# Patient Record
Sex: Male | Born: 1985
Health system: Southern US, Community
[De-identification: ages and names within clinical notes are randomized; demographics above are authoritative.]

## PROBLEM LIST (undated history)

## (undated) DIAGNOSIS — J302 Other seasonal allergic rhinitis: Secondary | ICD-10-CM

## (undated) DIAGNOSIS — T7840XA Allergy, unspecified, initial encounter: Secondary | ICD-10-CM

## (undated) HISTORY — DX: Allergy, unspecified, initial encounter: T78.40XA

---

## 2010-11-28 ENCOUNTER — Emergency Department (HOSPITAL_COMMUNITY)
Admission: EM | Admit: 2010-11-28 | Discharge: 2010-11-28 | Payer: Self-pay | Source: Home / Self Care | Admitting: Emergency Medicine

## 2011-02-13 LAB — POCT I-STAT, CHEM 8
BUN: 26 mg/dL — ABNORMAL HIGH (ref 6–23)
Calcium, Ion: 1.05 mmol/L — ABNORMAL LOW (ref 1.12–1.32)
Creatinine, Ser: 1.2 mg/dL (ref 0.4–1.5)
HCT: 47 % (ref 39.0–52.0)
Hemoglobin: 16 g/dL (ref 13.0–17.0)
Sodium: 135 mEq/L (ref 135–145)
TCO2: 30 mmol/L (ref 0–100)

## 2011-04-13 ENCOUNTER — Inpatient Hospital Stay (INDEPENDENT_AMBULATORY_CARE_PROVIDER_SITE_OTHER)
Admission: RE | Admit: 2011-04-13 | Discharge: 2011-04-13 | Disposition: A | Discharge status: Home / Self Care | Payer: Self-pay | Source: Ambulatory Visit | Attending: Family Medicine | Admitting: Family Medicine

## 2011-04-13 DIAGNOSIS — K006 Disturbances in tooth eruption: Secondary | ICD-10-CM

## 2012-09-19 ENCOUNTER — Ambulatory Visit (HOSPITAL_COMMUNITY)
Admission: RE | Admit: 2012-09-19 | Discharge: 2012-09-19 | Disposition: A | Discharge status: Home / Self Care | Payer: Self-pay | Source: Ambulatory Visit | Attending: Obstetrics and Gynecology | Admitting: Obstetrics and Gynecology

## 2012-09-19 DIAGNOSIS — Z31448 Encounter for other genetic testing of male for procreative management: Secondary | ICD-10-CM | POA: Insufficient documentation

## 2012-10-21 ENCOUNTER — Emergency Department (HOSPITAL_COMMUNITY): Payer: Self-pay

## 2012-10-21 ENCOUNTER — Emergency Department (HOSPITAL_COMMUNITY)
Admission: EM | Admit: 2012-10-21 | Discharge: 2012-10-21 | Disposition: A | Discharge status: Home / Self Care | Payer: Self-pay | Attending: Emergency Medicine | Admitting: Emergency Medicine

## 2012-10-21 ENCOUNTER — Encounter (HOSPITAL_COMMUNITY): Payer: Self-pay | Admitting: *Deleted

## 2012-10-21 DIAGNOSIS — S92309A Fracture of unspecified metatarsal bone(s), unspecified foot, initial encounter for closed fracture: Secondary | ICD-10-CM | POA: Insufficient documentation

## 2012-10-21 DIAGNOSIS — Y9339 Activity, other involving climbing, rappelling and jumping off: Secondary | ICD-10-CM | POA: Insufficient documentation

## 2012-10-21 DIAGNOSIS — S92355A Nondisplaced fracture of fifth metatarsal bone, left foot, initial encounter for closed fracture: Secondary | ICD-10-CM

## 2012-10-21 DIAGNOSIS — Y92838 Other recreation area as the place of occurrence of the external cause: Secondary | ICD-10-CM | POA: Insufficient documentation

## 2012-10-21 DIAGNOSIS — R296 Repeated falls: Secondary | ICD-10-CM | POA: Insufficient documentation

## 2012-10-21 DIAGNOSIS — M2550 Pain in unspecified joint: Secondary | ICD-10-CM | POA: Insufficient documentation

## 2012-10-21 DIAGNOSIS — F17200 Nicotine dependence, unspecified, uncomplicated: Secondary | ICD-10-CM | POA: Insufficient documentation

## 2012-10-21 DIAGNOSIS — Y9239 Other specified sports and athletic area as the place of occurrence of the external cause: Secondary | ICD-10-CM | POA: Insufficient documentation

## 2012-10-21 MED ORDER — OXYCODONE-ACETAMINOPHEN 5-325 MG PO TABS: 1.0000 | ORAL_TABLET | ORAL | Status: DC | PRN

## 2012-10-21 MED ORDER — OXYCODONE-ACETAMINOPHEN 5-325 MG PO TABS
1.0000 | ORAL_TABLET | Freq: Once | ORAL | Status: AC
  Administered 2012-10-21: 1 via ORAL
  Filled 2012-10-21: qty 1

## 2012-10-21 NOTE — ED Provider Notes (Signed)
History  This chart was scribed for Carleene Cooper III, MD by Shari Heritage, ED Scribe. The patient was seen in room TR05C/TR05C. Patient's care was started at 1356.  CSN: [id number]  Arrival date & time 10/21/12  1041   First MD Initiated Contact with Patient 10/21/12 1356      Chief Complaint  Patient presents with  . Foot Pain    The history is provided by the patient. No language interpreter was used.    HPI Comments: Tyler Oneill is a 26 y.o. male who presents to the Emergency Department complaining of severe, constant, non-radiating, gradually worsening left foot pain with associated swelling onset yesterday night. Patient denies numbness, weakness, or any other pain at this time. Patient states that he was celebrating while watching football last night, jumped up and then landed on his foot. Patient states that the pain began immediately after the incident and then began worsening late this morning at about 3:00am. Patient reports no significant past medical or surgical history. He does not take any medications. He has no known allergies. He is a current some day smoker.   No family history on file.  History  Substance Use Topics  . Smoking status: Current Some Day Smoker  . Smokeless tobacco: Not on file  . Alcohol Use: No      Review of Systems  Musculoskeletal: Positive for arthralgias (left foot pain).  Neurological: Negative for weakness and numbness.  All other systems reviewed and are negative.    Allergies  Review of patient's allergies indicates no known allergies.  Home Medications  No current outpatient prescriptions on file.  Triage Vitals: BP 124/63  Pulse 82  Temp 98.8 F (37.1 C) (Oral)  Resp 20  SpO2 99%  Physical Exam  Nursing note and vitals reviewed. Constitutional: He is oriented to person, place, and time. He appears well-developed and well-nourished. No distress.  HENT:  Head: Normocephalic and atraumatic.  Eyes: EOM are normal.  Pupils are equal, round, and reactive to light.  Neck: Neck supple. No tracheal deviation present.  Cardiovascular: Normal rate.   Pulmonary/Chest: Effort normal. No respiratory distress.  Abdominal: Soft. He exhibits no distension.  Musculoskeletal: Normal range of motion. He exhibits no edema.       Swelling over the lateral aspect of the left foot. Intact pulses, sensation and tendons in the left foot. X-ray shows fracture of 5th metatarsal.  Neurological: He is alert and oriented to person, place, and time. No sensory deficit.  Skin: Skin is warm and dry.  Psychiatric: He has a normal mood and affect. His behavior is normal.    ED Course  Procedures (including critical care time) DIAGNOSTIC STUDIES: Oxygen Saturation is 99% on room air, normal by my interpretation.    COORDINATION OF CARE: 2:00 PM- Patient informed of current plan for treatment and evaluation and agrees with plan at this time. Will administer 1 tablet of Percodet 5-325 mg and prescribe Percocet 5-325 mg for at home use. Will order post-op shoe and crutches then discharge patient with referral to on-call orthopedist.   Dg Foot Complete Left  10/21/2012  *RADIOLOGY REPORT*  Clinical Data: Injury, pain, swelling laterally  LEFT FOOT - COMPLETE 3+ VIEW  Comparison: None.  Findings: Acute nondisplaced transverse fracture of the left fifth metatarsal base noted.  Mild localized swelling.  No radiopaque foreign body.  No significant degenerative process.  Preserved joint spaces.  IMPRESSION: Acute nondisplaced left fifth metatarsal base fracture   Original Report Authenticated By:  M. Shick, M.D.      1. Nondisplaced fracture of fifth left metatarsal bone    I personally performed the services described in this documentation, which was scribed in my presence. The recorded information has been reviewed and is accurate. Osvaldo Human, M.D.     Carleene Cooper III, MD 10/23/12 [phone]

## 2012-10-21 NOTE — ED Notes (Signed)
PT states watching football last nite and did something to his left foot

## 2012-10-21 NOTE — Progress Notes (Signed)
Orthopedic Tech Progress Note Patient Details:  Tyler Oneill 01-04-1986 [id number]  Ortho Devices Type of Ortho Device: Crutches;Postop boot Ortho Device/Splint Location: left foot Ortho Device/Splint Interventions: Application   Tyler Oneill 10/21/2012, 2:15 PM

## 2012-10-21 NOTE — ED Notes (Signed)
Pt "jumped up" while watching football game yesterday, twisting left foot. Foot swollen and discolored.

## 2013-07-28 ENCOUNTER — Emergency Department: Payer: Self-pay | Admitting: Emergency Medicine

## 2014-02-24 ENCOUNTER — Emergency Department: Payer: Self-pay | Admitting: Emergency Medicine

## 2014-06-22 ENCOUNTER — Emergency Department: Payer: Self-pay | Admitting: Emergency Medicine

## 2014-07-03 ENCOUNTER — Emergency Department: Payer: Self-pay | Admitting: Emergency Medicine

## 2015-04-14 ENCOUNTER — Emergency Department: Payer: Worker's Compensation

## 2015-04-14 ENCOUNTER — Emergency Department
Admission: EM | Admit: 2015-04-14 | Discharge: 2015-04-14 | Disposition: A | Discharge status: Home / Self Care | Payer: Worker's Compensation | Attending: Emergency Medicine | Admitting: Emergency Medicine

## 2015-04-14 DIAGNOSIS — Z720 Tobacco use: Secondary | ICD-10-CM | POA: Insufficient documentation

## 2015-04-14 DIAGNOSIS — Y9289 Other specified places as the place of occurrence of the external cause: Secondary | ICD-10-CM | POA: Diagnosis not present

## 2015-04-14 DIAGNOSIS — Y998 Other external cause status: Secondary | ICD-10-CM | POA: Insufficient documentation

## 2015-04-14 DIAGNOSIS — Y9389 Activity, other specified: Secondary | ICD-10-CM | POA: Diagnosis not present

## 2015-04-14 DIAGNOSIS — S9781XA Crushing injury of right foot, initial encounter: Secondary | ICD-10-CM | POA: Diagnosis present

## 2015-04-14 DIAGNOSIS — W230XXA Caught, crushed, jammed, or pinched between moving objects, initial encounter: Secondary | ICD-10-CM | POA: Insufficient documentation

## 2015-04-14 MED ORDER — IBUPROFEN 800 MG PO TABS: 800.0000 mg | ORAL_TABLET | Freq: Three times a day (TID) | ORAL | Status: DC | PRN

## 2015-04-14 NOTE — ED Notes (Signed)
Talked with emplyee supoervisior Wilson SingerLefebvre said to do uds

## 2015-04-14 NOTE — Discharge Instructions (Signed)
Crush Injury, Fingers or Toes A crush injury means the fingers or toes are hurt by being squeezed (compressed). HOME CARE  Raise (elevate) the injured part above the level of your heart. Do this as much as you can for the first few days.  Put ice on the injured area.  Put ice in a plastic bag.  Place a towel between your skin and the bag.  Leave the ice on for 15-20 minutes, 03-04 times a day for the first 2 days.  Only take medicine as told by your doctor.  Use the injured part only as told by your doctor.  Change bandages (dressings) as told by your doctor.  Keep all doctor visits as told. GET HELP RIGHT AWAY IF:   There is redness, puffiness (swelling), or more pain in the injured finger or toe.  Yellowish-white fluid (pus) comes from the wound.  You have a fever.  A bad smell comes from the wound or bandage.  The wound breaks open.  You cannot move the injured finger or toe. MAKE SURE YOU:   Understand these instructions.  Will watch your condition.  Will get help right away if you are not doing well or get worse. Document Released: 05/10/2010 Document Revised: 02/12/2012 Document Reviewed: 04/07/2011 Exit[employer] Patient Information 2015 [employer]. This information is not intended to replace advice given to you by your health care provider. Make sure you discuss any questions you have with your health care provider.  

## 2015-04-14 NOTE — ED Notes (Signed)
Pt was at work, a pallet fell on right foot

## 2015-04-14 NOTE — ED Provider Notes (Signed)
[facility] Emergency Department Provider Note  ____________________________________________  Time seen  [id number] I have reviewed the triage vital signs and the nursing notes.   HISTORY  Chief Complaint Foot Injury    HPI Tyler Oneill is a 29 y.o. male was injured at work approximately 1 hour prior to his arrival. He states his right foot was wheeled over by a pallet. He has not taken any medication since this happened.He describes his pain as a burning aching sensation in his right foot with a pain scale of 8 out of 10.  moving his foot makes it worse. He denies any previous injury to his foot in the past.   No past medical history on file.  There are no active problems to display for this patient.   No past surgical history on file.  Current Outpatient Rx  Name  Route  Sig  Dispense  Refill  . ibuprofen (ADVIL,MOTRIN) 800 MG tablet   Oral   Take 1 tablet (800 mg total) by mouth every 8 (eight) hours as needed.   30 tablet   0   . oxyCODONE-acetaminophen (PERCOCET/ROXICET) 5-325 MG per tablet   Oral   Take 1 tablet by mouth every 4 (four) hours as needed for pain.   20 tablet   0     Allergies Review of patient's allergies indicates no known allergies.  No family history on file.  Social History History  Substance Use Topics  . Smoking status: Current Some Day Smoker  . Smokeless tobacco: Not on file  . Alcohol Use: No    Review of Systems Constitutional: No fever/chills Eyes: No visual changes. ENT: No sore throat. Cardiovascular: Denies chest pain. Respiratory: Denies shortness of breath. Gastrointestinal: No abdominal pain.  No nausea, no vomiting.  Genitourinary: Negative for dysuria. Musculoskeletal: Negative for back pain. Skin: Negative for rash. Neurological: Negative for headaches 10-point ROS otherwise negative.  ____________________________________________   PHYSICAL EXAM:  VITAL SIGNS: ED Triage Vitals  Enc  Vitals Group     BP 04/14/15 0911 122/70 mmHg     Pulse Rate 04/14/15 0911 68     Resp 04/14/15 0911 18     Temp 04/14/15 0911 98 F (36.7 C)     Temp Source 04/14/15 0911 Oral     SpO2 04/14/15 0911 98 %     Weight 04/14/15 0911 140 lb (63.504 kg)     Height 04/14/15 0911 5\' 8"  (1.727 m)     Head Cir --      Peak Flow --      Pain Score --      Pain Loc --      Pain Edu? --      Excl. in GC? --     Constitutional: Alert and oriented. Well appearing and in no acute distress. Eyes: Conjunctivae are normal. PERRL. EOMI. Head: Atraumatic. Nose: No congestion/rhinnorhea. Neck: No stridor.   Cardiovascular: Normal rate, regular rhythm. Grossly normal heart sounds.  Good peripheral circulation. Respiratory: Normal respiratory effort.  No retractions. Lungs CTAB. Gastrointestinal: Soft and nontender. No distention. No abdominal bruits. No CVA tenderness. Genitourinary:  Musculoskeletal: No lower extremity tenderness nor edema.  No joint effusions. Neurologic:  Normal speech and language. No gross focal neurologic deficits are appreciated. Speech is normal. No gait instability. Patient is able to feel toes with light palpation. Skin:  Skin is warm, dry and intact. No rash noted. Heavy fungal nail involvement involving the toes. Psychiatric: Mood and affect are normal.  Speech and behavior are normal.  ____________________________________________   LABS (all labs ordered are listed, but only abnormal results are displayed)  Labs Reviewed - No data to display ____________________________________________  EKG  Deferred ____________________________________________  RADIOLOGY  No fracture or dislocation ____________________________________________   PROCEDURES  Procedure(s) performed: None  Critical Care performed: No  ____________________________________________   INITIAL IMPRESSION / ASSESSMENT AND PLAN / ED COURSE  Pertinent labs & imaging results that were  available during my care of the patient were reviewed by me and considered in my medical decision making (see chart for details).  Patient was informed of his x-ray results. He was placed in a wooden shoe for support. Given a prescription for ibuprofen and instructed to use ice and elevate today. 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 ____________________________________________   FINAL CLINICAL IMPRESSION(S) / ED DIAGNOSES  Final diagnoses:  Crush injury of foot, right, initial encounter     Tyler RumpsRhonda L Hong Timm, PA-C 04/14/15 1143  Sharman CheekPhillip Stafford, MD 04/14/15 1527

## 2015-04-14 NOTE — ED Notes (Signed)
Pt voided, urine did not register a temp on cup for uds

## 2015-04-14 NOTE — ED Notes (Signed)
Pain in right foot, pallet wheeled over foot, occurred at work today, no  prolfile on record will call

## 2017-05-11 ENCOUNTER — Ambulatory Visit
Admission: RE | Admit: 2017-05-11 | Discharge: 2017-05-11 | Disposition: A | Discharge status: Home / Self Care | Payer: BLUE CROSS/BLUE SHIELD | Source: Ambulatory Visit | Attending: Family Medicine | Admitting: Family Medicine

## 2017-05-11 ENCOUNTER — Other Ambulatory Visit: Payer: Self-pay | Admitting: Family Medicine

## 2017-05-11 DIAGNOSIS — M542 Cervicalgia: Secondary | ICD-10-CM

## 2017-06-20 ENCOUNTER — Emergency Department
Admission: EM | Admit: 2017-06-20 | Discharge: 2017-06-20 | Disposition: A | Discharge status: Home / Self Care | Payer: BLUE CROSS/BLUE SHIELD | Attending: Emergency Medicine | Admitting: Emergency Medicine

## 2017-06-20 ENCOUNTER — Encounter: Payer: Self-pay | Admitting: Emergency Medicine

## 2017-06-20 DIAGNOSIS — H5711 Ocular pain, right eye: Secondary | ICD-10-CM | POA: Diagnosis present

## 2017-06-20 DIAGNOSIS — H578 Other specified disorders of eye and adnexa: Secondary | ICD-10-CM | POA: Diagnosis not present

## 2017-06-20 DIAGNOSIS — F17210 Nicotine dependence, cigarettes, uncomplicated: Secondary | ICD-10-CM | POA: Diagnosis not present

## 2017-06-20 DIAGNOSIS — H5789 Other specified disorders of eye and adnexa: Secondary | ICD-10-CM

## 2017-06-20 DIAGNOSIS — H61891 Other specified disorders of right external ear: Secondary | ICD-10-CM

## 2017-06-20 HISTORY — DX: Other seasonal allergic rhinitis: J30.2

## 2017-06-20 MED ORDER — ERYTHROMYCIN 5 MG/GM OP OINT
1.0000 | TOPICAL_OINTMENT | Freq: Three times a day (TID) | OPHTHALMIC | 0 refills | Status: AC
Start: 2017-06-20 — End: 2017-06-25

## 2017-06-20 MED ORDER — ERYTHROMYCIN 5 MG/GM OP OINT
TOPICAL_OINTMENT | Freq: Once | OPHTHALMIC | Status: AC
  Administered 2017-06-20: 1 via OPHTHALMIC
  Filled 2017-06-20: qty 1

## 2017-06-20 MED ORDER — FLUORESCEIN SODIUM 0.6 MG OP STRP
1.0000 | ORAL_STRIP | Freq: Once | OPHTHALMIC | Status: AC
  Administered 2017-06-20: 1 via OPHTHALMIC
  Filled 2017-06-20: qty 1

## 2017-06-20 MED ORDER — TETRACAINE HCL 0.5 % OP SOLN
1.0000 [drp] | Freq: Once | OPHTHALMIC | Status: AC
  Administered 2017-06-20: 1 [drp] via OPHTHALMIC
  Filled 2017-06-20: qty 4

## 2017-06-20 NOTE — ED Provider Notes (Signed)
[facility] Emergency Department Provider Note   ____________________________________________   First MD Initiated Contact with Patient 06/20/17 [phone]     (approximate)  I have reviewed the triage vital signs and the nursing notes.   HISTORY  Chief Complaint Eye Pain    HPI Tyler Oneill is a 31 y.o. male who comes into the hospital today with some eye irritation. The patient reports [place] [employer] He reports occasionally he gets dust in his eye. He states that today was a normal day but when he went to bed and cut his eye felt very irritated. He thought maybe had some sleepiness I so he rinsed it out with the shower head. He reports that his eye still felt very irritated. He feels that something is in any unable to get it out. He says at some point in the past he did have a small rock in his eye that he had to physically pick out. He thinks that something scratching on his eye. When his eyes open he states he doesn't feel it when his clothes he states that he does. He denies any drainage or discharge from his eye. He denies any blurred vision. The patient wanted to come in and get it checked out.   Past Medical History:  Diagnosis Date  . Seasonal allergies     There are no active problems to display for this patient.   History reviewed. No pertinent surgical history.  Prior to Admission medications   Medication Sig Start Date End Date Taking? Authorizing Provider  erythromycin ophthalmic ointment Place 1 application into the right eye 3 (three) times daily. 06/20/17 06/25/17  Rebecka ApleyWebster, Allison P, MD  ibuprofen (ADVIL,MOTRIN) 800 MG tablet Take 1 tablet (800 mg total) by mouth every 8 (eight) hours as needed. 04/14/15   Tommi RumpsSummers, Rhonda L, PA-C  oxyCODONE-acetaminophen (PERCOCET/ROXICET) 5-325 MG per tablet Take 1 tablet by mouth every 4 (four) hours as needed for pain. 10/21/12   Carleene Cooperavidson, Alan, MD    Allergies Patient has no  known allergies.  No family history on file.  Social History Social History  Substance Use Topics  . Smoking status: Current Some Day Smoker  . Smokeless tobacco: Never Used  . Alcohol use No    Review of Systems  Constitutional: No fever/chills Eyes: right eye irritation ENT: No sore throat. Cardiovascular: Denies chest pain. Respiratory: Denies shortness of breath. Gastrointestinal: No abdominal pain.  No nausea, no vomiting.  No diarrhea.  No constipation. Genitourinary: Negative for dysuria. Musculoskeletal: Negative for back pain. Skin: Negative for rash. Neurological: Negative for headaches, focal weakness or numbness.   ____________________________________________   PHYSICAL EXAM:  VITAL SIGNS: ED Triage Vitals  Enc Vitals Group     BP 06/20/17 0640 141/80     Pulse 06/20/17 0640 81     Resp 06/20/17 0640 15     Temp 06/20/17 0640 98.3     Temp src --      SpO2 06/20/17 0640 99%     Weight 06/20/17 0552 140 lb (63.5 kg)     Height 06/20/17 0552 5\' 9"  (1.753 m)     Head Circumference --      Peak Flow --      Pain Score 06/20/17 0551 6     Pain Loc --      Pain Edu? --      Excl. in GC? --     Constitutional: Alert and oriented. Well appearing and  in mild distress. Eyes: Conjunctivae are normal. PERRL. EOMI. Visual acuity performed, no pain on exam and no foreign body visualized using the Wood's lamp no uptake with fluorescein staining, no conjunctival injection, no discharge Head: Atraumatic. Nose: No congestion/rhinnorhea. Mouth/Throat: Mucous membranes are moist.  Oropharynx non-erythematous. Cardiovascular: Normal rate, regular rhythm. Grossly normal heart sounds.  Good peripheral circulation. Respiratory: Normal respiratory effort.  No retractions. Lungs CTAB. Gastrointestinal: Soft and nontender. No distention. Positive bowel sounds Musculoskeletal: No lower extremity tenderness nor edema.  Neurologic:  Normal speech and language.  Skin:  Skin  is warm, dry and intact.  Psychiatric: Mood and affect are normal. .  ____________________________________________   LABS (all labs ordered are listed, but only abnormal results are displayed)  Labs Reviewed - No data to display ____________________________________________  EKG  none ____________________________________________  RADIOLOGY  No results found.  ____________________________________________   PROCEDURES  Procedure(s) performed: None  Procedures  Critical Care performed: No  ____________________________________________   INITIAL IMPRESSION / ASSESSMENT AND PLAN / ED COURSE  Pertinent labs & imaging results that were available during my care of the patient were reviewed by me and considered in my medical decision making (see chart for details).  This is a 31 year old male who comes into the hospital today with some irritation to his eye. I examine the patient's eye and did not visualize a foreign body. He does not have any discharge nor does he have any scleral or conjunctival injection. I will place some erythromycin ointment in the patient's eye and he'll be discharged home to follow-up with ophthalmology should he continue to have persistent discomfort. The patient's visual acuity is unremarkable.      ____________________________________________   FINAL CLINICAL IMPRESSION(S) / ED DIAGNOSES  Final diagnoses:  Eye irritation  Foreign body sensation in right ear canal      NEW MEDICATIONS STARTED DURING THIS VISIT:  Discharge Medication List as of 06/20/2017  6:33 AM    START taking these medications   Details  erythromycin ophthalmic ointment Place 1 application into the right eye 3 (three) times daily., Starting Wed 06/20/2017, Until Mon 06/25/2017, Print         Note:  This document was prepared using Dragon voice recognition software and may include unintentional dictation errors.    Rebecka Apley, MD 06/20/17 [phone]

## 2017-06-20 NOTE — Discharge Instructions (Signed)
I did not visualize a foreign body in your cornea. Please follow up with the eye doctor should you continue to have this sensation in the next few days.

## 2017-06-20 NOTE — ED Triage Notes (Signed)
Patient ambulatory to triage with steady gait, without difficulty or distress noted; pt reports upon awakening this morning, drainage and discomfort to right eye that persists after rinsing; denies any visual changes

## 2019-03-20 DIAGNOSIS — R519 Headache, unspecified: Secondary | ICD-10-CM | POA: Insufficient documentation

## 2019-04-01 ENCOUNTER — Emergency Department
Admission: EM | Admit: 2019-04-01 | Discharge: 2019-04-01 | Disposition: A | Discharge status: Home / Self Care | Payer: BLUE CROSS/BLUE SHIELD | Attending: Emergency Medicine | Admitting: Emergency Medicine

## 2019-04-01 ENCOUNTER — Emergency Department: Payer: BLUE CROSS/BLUE SHIELD

## 2019-04-01 ENCOUNTER — Encounter: Payer: Self-pay | Admitting: Emergency Medicine

## 2019-04-01 ENCOUNTER — Other Ambulatory Visit: Payer: Self-pay

## 2019-04-01 DIAGNOSIS — Z79899 Other long term (current) drug therapy: Secondary | ICD-10-CM | POA: Insufficient documentation

## 2019-04-01 DIAGNOSIS — F17210 Nicotine dependence, cigarettes, uncomplicated: Secondary | ICD-10-CM | POA: Diagnosis not present

## 2019-04-01 DIAGNOSIS — T50905A Adverse effect of unspecified drugs, medicaments and biological substances, initial encounter: Secondary | ICD-10-CM

## 2019-04-01 DIAGNOSIS — T43015A Adverse effect of tricyclic antidepressants, initial encounter: Secondary | ICD-10-CM | POA: Diagnosis not present

## 2019-04-01 DIAGNOSIS — G2402 Drug induced acute dystonia: Secondary | ICD-10-CM | POA: Diagnosis not present

## 2019-04-01 DIAGNOSIS — M542 Cervicalgia: Secondary | ICD-10-CM | POA: Diagnosis present

## 2019-04-01 LAB — CBC
HCT: 42 % (ref 39.0–52.0)
Hemoglobin: 14.1 g/dL (ref 13.0–17.0)
MCH: 30.2 pg (ref 26.0–34.0)
MCHC: 33.6 g/dL (ref 30.0–36.0)
MCV: 89.9 fL (ref 80.0–100.0)
Platelets: 179 K/uL (ref 150–400)
RBC: 4.67 MIL/uL (ref 4.22–5.81)
RDW: 14.2 % (ref 11.5–15.5)
WBC: 7.3 K/uL (ref 4.0–10.5)
nRBC: 0 % (ref 0.0–0.2)

## 2019-04-01 LAB — BASIC METABOLIC PANEL WITH GFR
Anion gap: 8 (ref 5–15)
BUN: 13 mg/dL (ref 6–20)
CO2: 26 mmol/L (ref 22–32)
Calcium: 9.2 mg/dL (ref 8.9–10.3)
Chloride: 103 mmol/L (ref 98–111)
Creatinine, Ser: 0.91 mg/dL (ref 0.61–1.24)
GFR calc Af Amer: >60 mL/min (ref >60)
GFR calc non Af Amer: >60 mL/min (ref >60)
Glucose, Bld: 120 mg/dL — ABNORMAL HIGH (ref 70–99)
Potassium: 3.8 mmol/L (ref 3.5–5.1)
Sodium: 137 mmol/L (ref 135–145)

## 2019-04-01 LAB — TROPONIN I: Troponin I: <0.03 ng/mL (ref <0.03)

## 2019-04-01 MED ORDER — METHOCARBAMOL 500 MG PO TABS: 500.0000 mg | ORAL_TABLET | Freq: Four times a day (QID) | ORAL | 0 refills | Status: DC

## 2019-04-01 MED ORDER — BUTALBITAL-APAP-CAFFEINE 50-325-40 MG PO TABS: 1.0000 | ORAL_TABLET | Freq: Four times a day (QID) | ORAL | 0 refills | Status: DC | PRN

## 2019-04-01 MED ORDER — GADOBUTROL 1 MMOL/ML IV SOLN
6.0000 mL | Freq: Once | INTRAVENOUS | Status: AC | PRN
  Administered 2019-04-01: 17:00:00 6 mL via INTRAVENOUS
  Filled 2019-04-01: qty 6

## 2019-04-01 MED ORDER — OXYCODONE-ACETAMINOPHEN 5-325 MG PO TABS
1.0000 | ORAL_TABLET | Freq: Once | ORAL | Status: AC
  Administered 2019-04-01: 16:00:00 1 via ORAL
  Filled 2019-04-01: qty 1

## 2019-04-01 MED ORDER — MELOXICAM 15 MG PO TABS: 15.0000 mg | ORAL_TABLET | Freq: Every day | ORAL | 0 refills | Status: DC

## 2019-04-01 MED ORDER — BUTALBITAL-APAP-CAFFEINE 50-325-40 MG PO TABS: 1.0000 | ORAL_TABLET | Freq: Four times a day (QID) | ORAL | 0 refills | Status: AC | PRN

## 2019-04-01 MED ORDER — KETOROLAC TROMETHAMINE 30 MG/ML IJ SOLN
30.0000 mg | Freq: Once | INTRAMUSCULAR | Status: AC
  Administered 2019-04-01: 30 mg via INTRAVENOUS
  Filled 2019-04-01: qty 1

## 2019-04-01 MED ORDER — LIDOCAINE 5 % EX PTCH
1.0000 | MEDICATED_PATCH | CUTANEOUS | Status: DC
  Administered 2019-04-01: 11:00:00 1 via TRANSDERMAL
  Filled 2019-04-01: qty 1

## 2019-04-01 MED ORDER — DIPHENHYDRAMINE HCL 50 MG/ML IJ SOLN
25.0000 mg | Freq: Once | INTRAMUSCULAR | Status: AC
  Administered 2019-04-01: 16:00:00 25 mg via INTRAVENOUS
  Filled 2019-04-01: qty 1

## 2019-04-01 MED ORDER — SODIUM CHLORIDE 0.9 % IV BOLUS
1000.0000 mL | Freq: Once | INTRAVENOUS | Status: AC
  Administered 2019-04-01: 11:00:00 1000 mL via INTRAVENOUS

## 2019-04-01 MED ORDER — ORPHENADRINE CITRATE 30 MG/ML IJ SOLN
60.0000 mg | Freq: Two times a day (BID) | INTRAMUSCULAR | Status: DC
  Administered 2019-04-01: 11:00:00 60 mg via INTRAVENOUS
  Filled 2019-04-01: qty 2

## 2019-04-01 MED ORDER — DIPHENHYDRAMINE HCL 50 MG/ML IJ SOLN
25.0000 mg | Freq: Once | INTRAMUSCULAR | Status: AC
  Administered 2019-04-01: 14:00:00 25 mg via INTRAVENOUS
  Filled 2019-04-01: qty 1

## 2019-04-01 NOTE — ED Triage Notes (Signed)
Pt arrives with complaints of neck and back pain. PT reports the pains were first present 2 years prior due to strain from work. Pt states the pain has been intermittent since. However, pt states since Saturday the pain has been constant. Pt also diagnosed with tension headaches from PCP. Pt given medication for headaches.

## 2019-04-01 NOTE — ED Notes (Signed)
Back from MRI  Awaiting results 

## 2019-04-01 NOTE — ED Notes (Signed)
Pt is speaking with MRI   Min relief with meds

## 2019-04-01 NOTE — ED Notes (Signed)
See triage note  Presents from [facility] with neck and upper back pain  States pain is like a stiffness to neck and moves into upper back and bilateral shoulder blades  States pain started on Saturday  Denies any injury or fever  States he has hahd headaches and was placed on Pamelor for same  States headache are better at present  Has only tried heat to neck for pain

## 2019-04-01 NOTE — ED Provider Notes (Signed)
[facility] Emergency Department Provider Note  ____________________________________________  Time seen: Approximately 9:54 AM  I have reviewed the triage vital signs and the nursing notes.   HISTORY  Chief Complaint Back Pain    HPI Tyler Oneill is a 33 y.o. male that presents emergency department for evaluation of neck pain for 4 days.  Patient states that it feels like his spine is "locked up." Pain is worse to his low neck and upper back.  Pain radiates into both of his shoulders.  He has pain with any range of motion of his spine or neck.  Pain is worse when he turns his neck to the side.  Patient states that he was started on nortriptyline 1.5 weeks ago for headaches.  Nortriptyline significantly improved his headaches and he denies any headache today.  He was scheduled to have a follow-up appointment with neurology on Thursday but could not wait that long.  He went to [facility], who brought him to the emergency department for severe pain.  No IV drug use.  His father had a spinal abscess when he was young.  No fever, chills, dizziness, shortness of breath, chest pain, palpitation, nausea, vomiting, abdominal pain.  Past Medical History:  Diagnosis Date  . Seasonal allergies     There are no active problems to display for this patient.   History reviewed. No pertinent surgical history.  Prior to Admission medications   Medication Sig Start Date End Date Taking? Authorizing Provider  nortriptyline (PAMELOR) 10 MG capsule Take 20 mg by mouth at bedtime.    Yes [provider]  acetaminophen (TYLENOL) 500 MG tablet Take 1,000 mg by mouth every 6 (six) hours.    [provider]  fluticasone (FLONASE) 50 MCG/ACT nasal spray Place 2 sprays into the nose daily.    [provider]    Allergies Patient has no known allergies.  No family history on file.  Social History Social History   Tobacco Use  . Smoking status:  Current Some Day Smoker  . Smokeless tobacco: Never Used  Substance Use Topics  . Alcohol use: No  . Drug use: No     Review of Systems  Constitutional: No fever/chills Cardiovascular: No chest pain. Respiratory: No SOB. Gastrointestinal: No abdominal pain.  No nausea, no vomiting.  Musculoskeletal: Positive for neck and back pain. Skin: Negative for rash, abrasions, lacerations, ecchymosis. Neurological: Negative for headaches, numbness or tingling   ____________________________________________   PHYSICAL EXAM:  VITAL SIGNS: ED Triage Vitals  Enc Vitals Group     BP 04/01/19 0904 120/81     Pulse Rate 04/01/19 0904 (!) 112     Resp 04/01/19 0904 16     Temp 04/01/19 0904 98.2 F (36.8 C)     Temp Source 04/01/19 0904 Oral     SpO2 04/01/19 0904 99 %     Weight 04/01/19 0905 135 lb (61.2 kg)     Height 04/01/19 0905 5\' 8"  (1.727 m)     Head Circumference --      Peak Flow --      Pain Score 04/01/19 0905 10     Pain Loc --      Pain Edu? --      Excl. in GC? --      Constitutional: Alert and oriented. Well appearing and in no acute distress. Eyes: Conjunctivae are normal. PERRL. EOMI. Head: Atraumatic. ENT:      Ears:      Nose:  No congestion/rhinnorhea.      Mouth/Throat: Mucous membranes are moist.  Neck: No stridor.  Inferior cervical spine tenderness to palpation.  Pain with right rotation of neck.  Limited range of motion of neck due to pain. Cardiovascular: Normal rate, regular rhythm.  Good peripheral circulation. Respiratory: Normal respiratory effort without tachypnea or retractions. Lungs CTAB. Good air entry to the bases with no decreased or absent breath sounds. Musculoskeletal: Full range of motion to all extremities. No gross deformities appreciated.  Strength equal in upper extremities bilaterally.  Tenderness to palpation throughout lumbar spine and lumbar paraspinal muscles.  Limited flexion, extension, rotation of thoracic spine due to  pain. Neurologic:  Normal speech and language. No gross focal neurologic deficits are appreciated. Negative Brusinski, Kernig. Skin:  Skin is warm, dry and intact. No rash noted. Psychiatric: Mood and affect are normal. Speech and behavior are normal. Patient exhibits appropriate insight and judgement.   ____________________________________________   LABS (all labs ordered are listed, but only abnormal results are displayed)  Labs Reviewed  BASIC METABOLIC PANEL - Abnormal; Notable for the following components:      Result Value   Glucose, Bld 120 (*)    All other components within normal limits  CBC  TROPONIN I   ____________________________________________  EKG   ____________________________________________  RADIOLOGY Lexine Baton, personally viewed and evaluated these images (plain radiographs) as part of my medical decision making, as well as reviewing the written report by the radiologist.  Dg Chest 2 View  Result Date: 04/01/2019 CLINICAL DATA:  Chest pain EXAM: CHEST - 2 VIEW COMPARISON:  July 28, 2013 FINDINGS: The lungs are clear. The heart size and pulmonary vascularity are normal. No adenopathy. No pneumothorax. No bone lesions. IMPRESSION: No edema or consolidation. Electronically Signed   By: Bretta Bang III M.D.   On: 04/01/2019 10:28   Dg Cervical Spine 2-3 Views  Result Date: 04/01/2019 CLINICAL DATA:  Cervicalgia EXAM: CERVICAL SPINE - 2-3 VIEW COMPARISON:  May 11, 2017 FINDINGS: Frontal, lateral, and open-mouth odontoid images were obtained. There is no fracture or spondylolisthesis. Prevertebral soft tissues and predental space regions are normal. There is mild disc space narrowing at C7-T1. Other disc spaces appear unremarkable. No erosive change. Lung apices are clear. IMPRESSION: Mild disc space narrowing at C7-T1. Other disc spaces appear unremarkable. No fracture or spondylolisthesis. Electronically Signed   By: Bretta Bang III M.D.   On:  04/01/2019 10:27    ____________________________________________    PROCEDURES  Procedure(s) performed:    Procedures    Medications  orphenadrine (NORFLEX) injection 60 mg (60 mg Intravenous Given 04/01/19 1039)  lidocaine (LIDODERM) 5 % 1 patch (1 patch Transdermal Patch Applied 04/01/19 1039)  ketorolac (TORADOL) 30 MG/ML injection 30 mg (30 mg Intravenous Given 04/01/19 1039)  sodium chloride 0.9 % bolus 1,000 mL (0 mLs Intravenous Stopped 04/01/19 1349)  diphenhydrAMINE (BENADRYL) injection 25 mg (25 mg Intravenous Given 04/01/19 1349)  diphenhydrAMINE (BENADRYL) injection 25 mg (25 mg Intravenous Given 04/01/19 1540)  oxyCODONE-acetaminophen (PERCOCET/ROXICET) 5-325 MG per tablet 1 tablet (1 tablet Oral Given 04/01/19 1540)     ____________________________________________   INITIAL IMPRESSION / ASSESSMENT AND PLAN / ED COURSE  Pertinent labs & imaging results that were available during my care of the patient were reviewed by me and considered in my medical decision making (see chart for details).  Review of the  CSRS was performed in accordance of the [place] prior to dispensing any controlled drugs.  Patient presented to emergency department for evaluation of neck pain for 4 days.  Vital signs and lab work are reassuring.  Chest x-ray and cervical x-ray are negative for acute bony abnormalities.  Symptoms are likely dystonic due to new nortriptyline prescription.  Patient was given Norflex and Toradol for pain with minimal improvement of symptoms.  He was given a dose of Benadryl for possible dystonic reaction.  Unlikely meningitis as patient has no meningeal signs, headache, increased WBC on CBC.  Unlikely cardiac etiology as pain is reproducible.  Troponin is negative. EKG shows normal sinus rhythm. He was mildly tachycardic on arrival which resolved with fluid bolus. MRI was ordered to rule out spinal abscess or mass.  Care was transferred to PA  Cuthriell awaiting MRI  with plans to discharge home. Patient is to follow up with neurology as directed. Patient is given ED precautions to return to the ED for any worsening or new symptoms.     ____________________________________________  FINAL CLINICAL IMPRESSION(S) / ED DIAGNOSES  Final diagnoses:  None      NEW MEDICATIONS STARTED DURING THIS VISIT:  ED Discharge Orders    None          This chart was dictated using voice recognition software/Dragon. Despite best efforts to proofread, errors can occur which can change the meaning. Any change was purely unintentional.    Enid DerryWagner, Chia Rock, PA-C 04/01/19 1617    Emily FilbertWilliams, Jonathan E, MD 04/02/19 1344

## 2019-04-01 NOTE — ED Notes (Signed)
Resting at present  Awaiting fir MRI

## 2019-04-01 NOTE — ED Provider Notes (Signed)
-----------------------------------------   6:34 PM on 04/01/2019 -----------------------------------------   Blood pressure 110/62, pulse 73, temperature 98.1 F (36.7 C), temperature source Oral, resp. rate 18, height 5\' 8"  (1.727 m), weight 61.2 kg, SpO2 99 %.  Assuming care from Ronni Rumble, PA-C in short, Tyler Oneill is a 33 y.o. male with a chief complaint of Back Pain .  Refer to the original H&P for additional details.  The current plan of care is to await MRI results.  Patient presents emergency department with neck pain, upper back pain and stiffness.  See previous providers note for HPI, physical exam.  At this time, patient care was transferred to myself pending MRI.  This returns with mild C6-C7 bulge.  With this finding, I do not suspect that this is the root cause of patient's symptoms.  Patient was recently started on amitriptyline, had an increase in dose of amitriptyline the day before symptoms began.  I suspect that this is a dystonic reaction.  After Benadryl, patient has been improving in symptoms.  Patient is encouraged to use 50 mg of Benadryl tonight before bed and to repeat tomorrow if necessary.  I will provide anti-inflammatory muscle relaxer to further alleviate patient's symptoms.  He has an appointment with neurology in 2 days to evaluate effect of his amitriptyline.  Have advised him not to restart the medication until talking to neurology.  I will prescribe Fioricet for headache should he have a headache developed in the next 36 hours prior to seeing neurology.  ED diagnosis: Dystonic reaction  New medications: Meloxicam Robaxin Fioricet        Lanette Hampshire 04/01/19 Daneil Dan, MD 04/01/19 2015

## 2019-04-01 NOTE — ED Notes (Signed)
Patient transported to MRI 

## 2019-06-27 DIAGNOSIS — Z20828 Contact with and (suspected) exposure to other viral communicable diseases: Secondary | ICD-10-CM | POA: Diagnosis not present

## 2020-03-31 DIAGNOSIS — H5213 Myopia, bilateral: Secondary | ICD-10-CM | POA: Diagnosis not present

## 2020-05-22 IMAGING — CR CERVICAL SPINE - 2-3 VIEW
3 series · 3 of 3 positions shown · non-contrast
Comparison: May 11, 2017

CLINICAL DATA: Cervicalgia

EXAM:
CERVICAL SPINE - 2-3 VIEW

[c-spine lat]
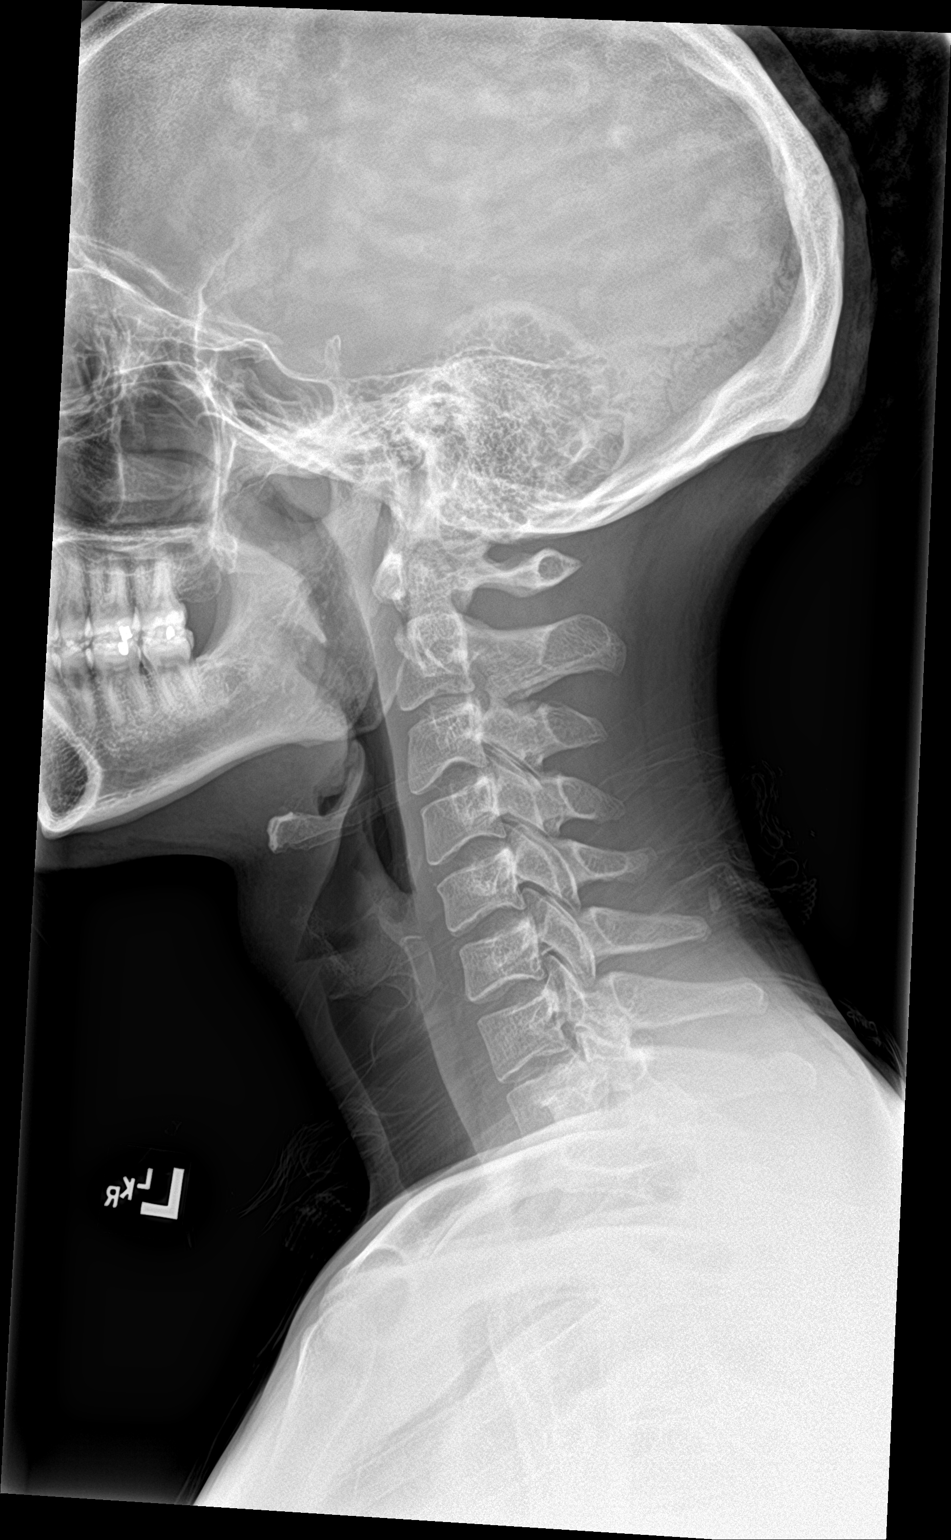

[c-spine ap]
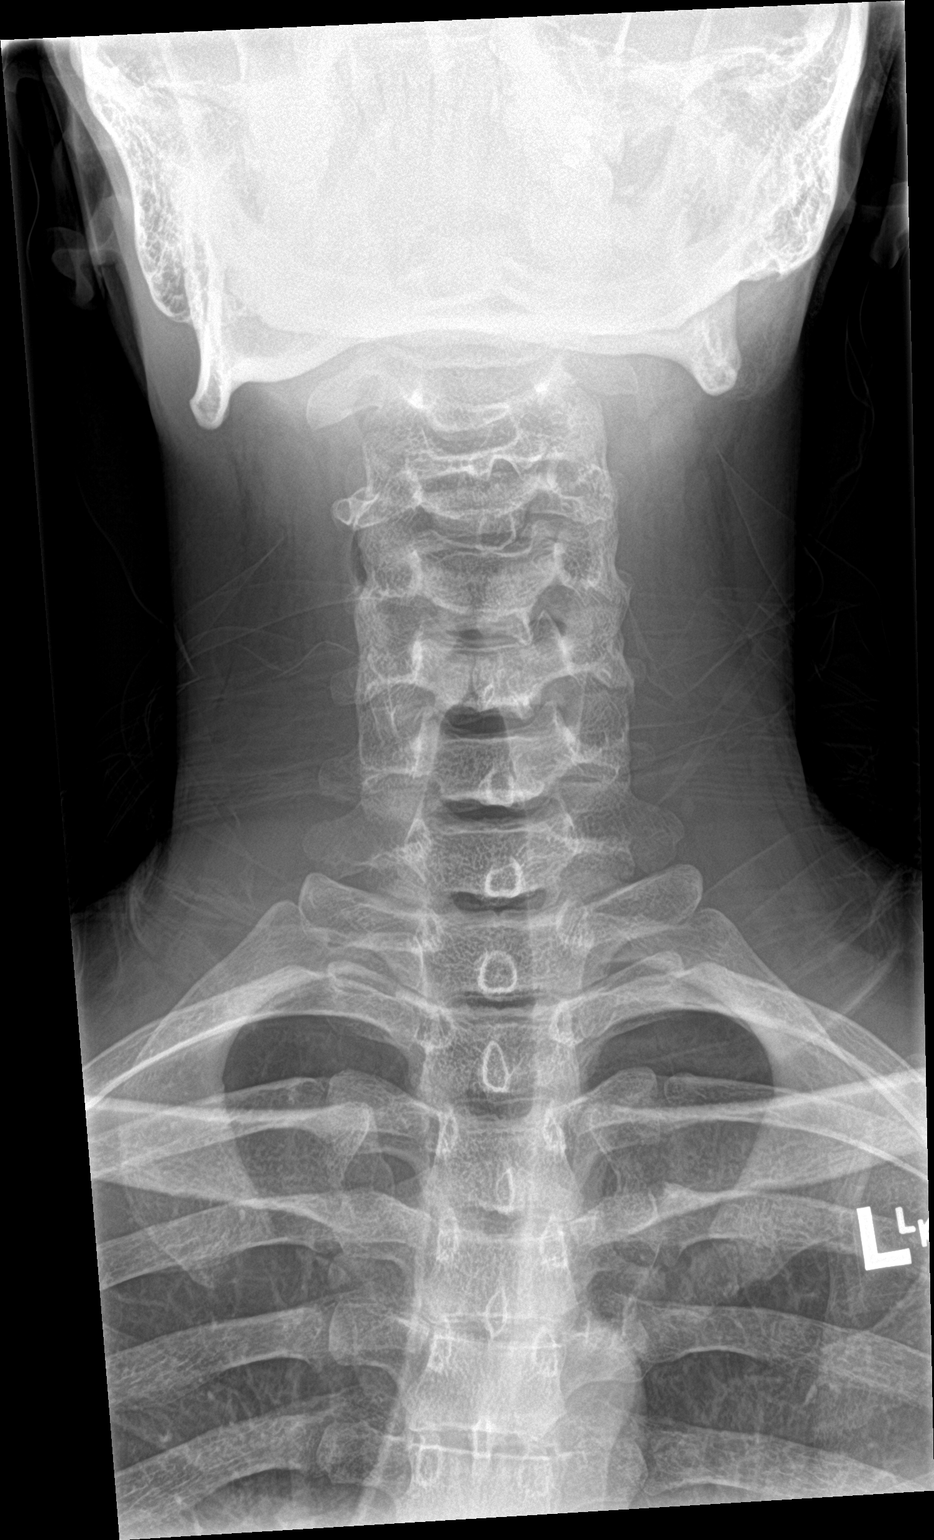

[c-spine open mouth]
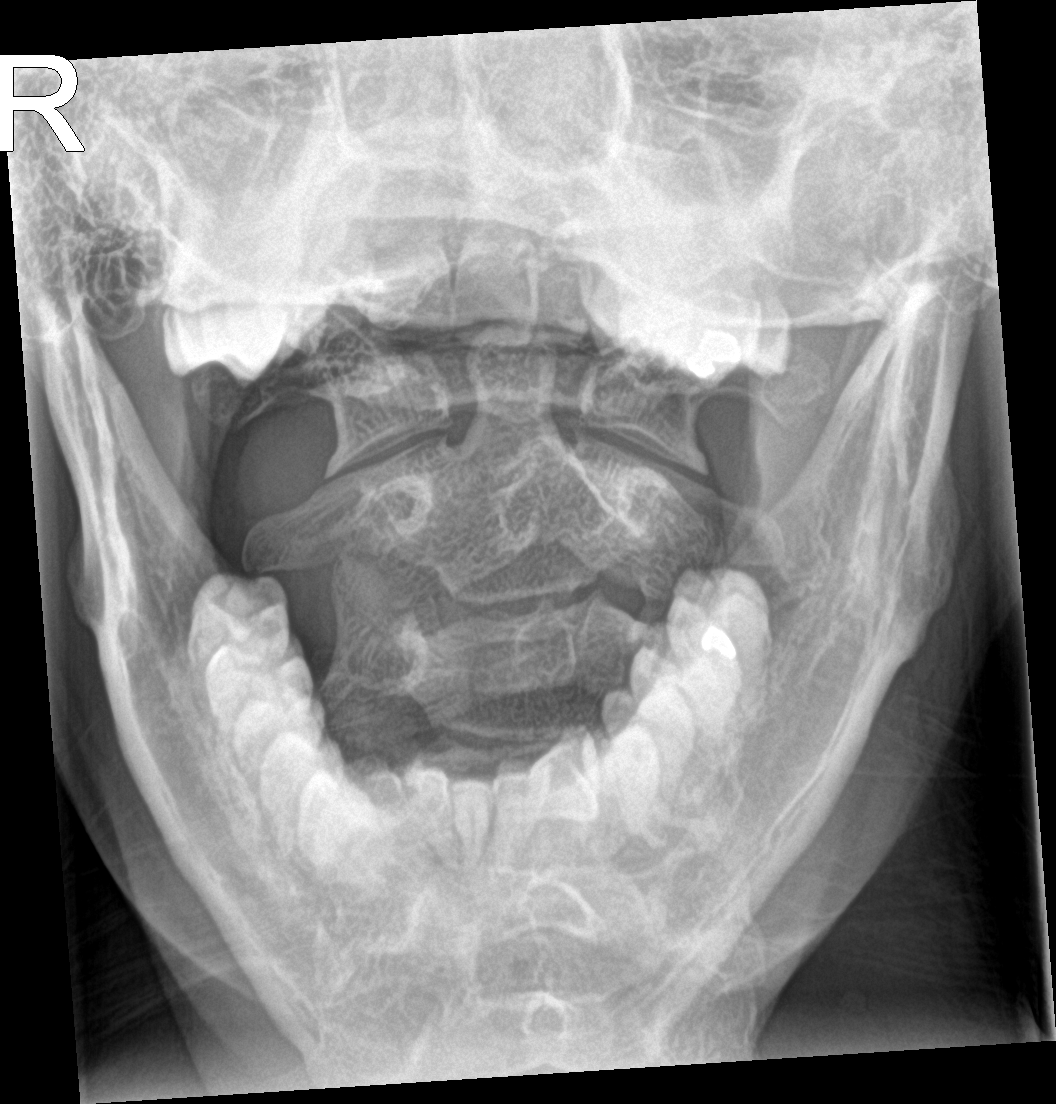

[3 of 3 positions shown; findings below may reference images not displayed]

FINDINGS: Frontal, lateral, and open-mouth odontoid images were obtained.
There is no fracture or spondylolisthesis. Prevertebral soft tissues
and predental space regions are normal. There is mild disc space
narrowing at C7-T1. Other disc spaces appear unremarkable. No
erosive change. Lung apices are clear.
IMPRESSION: Mild disc space narrowing at C7-T1. Other disc spaces appear
unremarkable. No fracture or spondylolisthesis.

## 2020-07-20 ENCOUNTER — Other Ambulatory Visit: Payer: Self-pay

## 2020-07-20 ENCOUNTER — Ambulatory Visit (INDEPENDENT_AMBULATORY_CARE_PROVIDER_SITE_OTHER): Payer: 59

## 2020-07-20 DIAGNOSIS — Z23 Encounter for immunization: Secondary | ICD-10-CM | POA: Diagnosis not present

## 2020-07-20 NOTE — Progress Notes (Signed)
   Covid-19 Vaccination Clinic  Name:  [name]    MRN: [id number] DOB: 1986-10-08  07/20/2020  Mr. Carlyon was observed post Covid-19 immunization for 15 minutes without incident. He was provided with Vaccine Information Sheet and instruction to access the V-Safe system.   Mr. Haubner was instructed to call 911 with any severe reactions post vaccine: Marland Kitchen Difficulty breathing  . Swelling of face and throat  . A fast heartbeat  . A bad rash all over body  . Dizziness and weakness   Immunizations Administered    Name Date Dose VIS Date Route   Pfizer COVID-19 Vaccine 07/20/2020  5:10 PM 0.3 mL 01/28/2019 Intramuscular   Manufacturer: [employer], [employer]   Lot: [id number]   NDC: [address]-5

## 2020-08-17 ENCOUNTER — Ambulatory Visit (INDEPENDENT_AMBULATORY_CARE_PROVIDER_SITE_OTHER): Payer: 59

## 2020-08-17 DIAGNOSIS — Z23 Encounter for immunization: Secondary | ICD-10-CM

## 2020-08-17 NOTE — Progress Notes (Signed)
° °  Covid-19 Vaccination Clinic  Name:  Tyler Oneill    MRN: [id number] DOB: 29-Dec-1985  08/17/2020  Mr. [name] was observed post Covid-19 immunization for 15 minutes without incident. He was provided with Vaccine Information Sheet and instruction to access the V-Safe system.   Mr. Shin was instructed to call 911 with any severe reactions post vaccine:  Difficulty breathing   Swelling of face and throat   A fast heartbeat   A bad rash all over body   Dizziness and weakness   Immunizations Administered    Name Date Dose VIS Date Route   Pfizer COVID-19 Vaccine 08/17/2020  5:01 PM 0.3 mL 01/28/2019 Intramuscular   Manufacturer: [employer], [employer]   Lot: [id number]   NDC: [address]-6

## 2020-11-02 DIAGNOSIS — Z20822 Contact with and (suspected) exposure to covid-19: Secondary | ICD-10-CM | POA: Diagnosis not present

## 2020-11-02 DIAGNOSIS — Z03818 Encounter for observation for suspected exposure to other biological agents ruled out: Secondary | ICD-10-CM | POA: Diagnosis not present

## 2020-11-02 DIAGNOSIS — U071 COVID-19: Secondary | ICD-10-CM | POA: Diagnosis not present

## 2020-11-09 DIAGNOSIS — Z1152 Encounter for screening for COVID-19: Secondary | ICD-10-CM | POA: Diagnosis not present

## 2020-11-09 DIAGNOSIS — Z03818 Encounter for observation for suspected exposure to other biological agents ruled out: Secondary | ICD-10-CM | POA: Diagnosis not present

## 2021-04-26 ENCOUNTER — Other Ambulatory Visit: Payer: Self-pay

## 2021-04-26 ENCOUNTER — Ambulatory Visit: Payer: Self-pay

## 2021-04-26 DIAGNOSIS — Z0000 Encounter for general adult medical examination without abnormal findings: Secondary | ICD-10-CM

## 2021-04-26 DIAGNOSIS — E611 Iron deficiency: Secondary | ICD-10-CM | POA: Insufficient documentation

## 2021-04-26 NOTE — Progress Notes (Signed)
Scheduled to complete physical 05/05/21 with Adam Scarboro, NP-C.  AMD 

## 2021-04-27 LAB — CMP12+LP+TP+TSH+6AC+CBC/D/PLT
ALT: 16 IU/L (ref 0–44)
AST: 23 IU/L (ref 0–40)
Albumin/Globulin Ratio: 2.2 (ref 1.2–2.2)
Albumin: 4.7 g/dL (ref 4.0–5.0)
Alkaline Phosphatase: 67 IU/L (ref 44–121)
BUN/Creatinine Ratio: 17 (ref 9–20)
BUN: 16 mg/dL (ref 6–20)
Basophils Absolute: 0 10*3/uL (ref 0.0–0.2)
Basos: 1 %
Bilirubin Total: 0.4 mg/dL (ref 0.0–1.2)
Calcium: 9.4 mg/dL (ref 8.7–10.2)
Chloride: 104 mmol/L (ref 96–106)
Chol/HDL Ratio: 2 ratio (ref 0.0–5.0)
Cholesterol, Total: 134 mg/dL (ref 100–199)
Creatinine, Ser: 0.93 mg/dL (ref 0.76–1.27)
EOS (ABSOLUTE): 0.2 10*3/uL (ref 0.0–0.4)
Eos: 4 %
Estimated CHD Risk: <0.5 times avg. (ref 0.0–1.0)
Free Thyroxine Index: 1.9 (ref 1.2–4.9)
GGT: 20 IU/L (ref 0–65)
Globulin, Total: 2.1 g/dL (ref 1.5–4.5)
Glucose: 86 mg/dL (ref 65–99)
HDL: 67 mg/dL (ref >39)
Hematocrit: 41.4 % (ref 37.5–51.0)
Hemoglobin: 13.5 g/dL (ref 13.0–17.7)
Immature Grans (Abs): 0 10*3/uL (ref 0.0–0.1)
Immature Granulocytes: 0 %
Iron: 74 ug/dL (ref 38–169)
LDH: 130 IU/L (ref 121–224)
LDL Chol Calc (NIH): 53 mg/dL (ref 0–99)
Lymphocytes Absolute: 2.2 10*3/uL (ref 0.7–3.1)
Lymphs: 44 %
MCH: 30.1 pg (ref 26.6–33.0)
MCHC: 32.6 g/dL (ref 31.5–35.7)
MCV: 92 fL (ref 79–97)
Monocytes Absolute: 0.5 10*3/uL (ref 0.1–0.9)
Monocytes: 10 %
Neutrophils Absolute: 2.1 10*3/uL (ref 1.4–7.0)
Neutrophils: 41 %
Phosphorus: 3.1 mg/dL (ref 2.8–4.1)
Platelets: 181 10*3/uL (ref 150–450)
Potassium: 4.4 mmol/L (ref 3.5–5.2)
RBC: 4.48 x10E6/uL (ref 4.14–5.80)
RDW: 13.1 % (ref 11.6–15.4)
Sodium: 141 mmol/L (ref 134–144)
T3 Uptake Ratio: 24 % (ref 24–39)
T4, Total: 7.8 ug/dL (ref 4.5–12.0)
TSH: 1.2 u[IU]/mL (ref 0.450–4.500)
Total Protein: 6.8 g/dL (ref 6.0–8.5)
Triglycerides: 69 mg/dL (ref 0–149)
Uric Acid: 3.5 mg/dL — ABNORMAL LOW (ref 3.8–8.4)
VLDL Cholesterol Cal: 14 mg/dL (ref 5–40)
WBC: 5 10*3/uL (ref 3.4–10.8)
eGFR: 111 mL/min/{1.73_m2} (ref >59)

## 2021-05-05 ENCOUNTER — Other Ambulatory Visit: Payer: Self-pay

## 2021-05-05 ENCOUNTER — Encounter: Payer: Self-pay | Admitting: Adult Health

## 2021-05-05 ENCOUNTER — Ambulatory Visit: Payer: Self-pay | Admitting: Adult Health

## 2021-05-05 VITALS — BP 112/67 | HR 82 | Temp 98.9°F | Resp 16 | Ht 68.0 in | Wt 131.0 lb

## 2021-05-05 DIAGNOSIS — Z0000 Encounter for general adult medical examination without abnormal findings: Secondary | ICD-10-CM

## 2021-05-05 NOTE — Progress Notes (Signed)
[facility] [place],  [address]  Internal MEDICINE  Office Visit Note  Patient Name: Tyler Oneill  [id number]  [id number]  Date of Service: 05/05/2021  Chief Complaint  Patient presents with  . Annual Exam     HPI Pt is here for routine health maintenance examination.  He is a well appearing 35 yo AA male who works for the [employer] here at [employer].  Mr Kulikowski has worked for [employer] for 5 years.  He has been married for 5 years and has 4 children ages 40-14.  His medical surgical history is reviewed.  His only medications are tylenol and Flonase. He stopped using tobacco products in 2020, he rarely drinks alcohol and denies any illicit drug use.  He has no complaints at this time.   Current Medication: Outpatient Encounter Medications as of 05/05/2021  Medication Sig Note  . fluticasone (FLONASE) 50 MCG/ACT nasal spray Place 2 sprays into the nose daily.   Marland Kitchen acetaminophen (TYLENOL) 500 MG tablet Take 1,000 mg by mouth every 6 (six) hours. 05/05/2021: Takes prn   No facility-administered encounter medications on file as of 05/05/2021.    Surgical History: History reviewed. No pertinent surgical history.  Medical History: Past Medical History:  Diagnosis Date  . Allergy    Phreesia 08/14/2020  . Seasonal allergies     Family History: Family History  Problem Relation Age of Onset  . Diabetes Father   . Diabetes Maternal Grandmother   . Diabetes Paternal Grandmother     Social History: Social History   Socioeconomic History  . Marital status: Married    Spouse name: Not on file  . Number of children: Not on file  . Years of education: Not on file  . Highest education level: Not on file  Occupational History  . Not on file  Tobacco Use  . Smoking status: Former Smoker    Quit date: 12/27/2018    Years since quitting: 2.3  . Smokeless tobacco: Never Used  Substance and Sexual Activity  . Alcohol use: No  . Drug use: No  . Sexual  activity: Not on file  Other Topics Concern  . Not on file  Social History Narrative  . Not on file   Social Determinants of Health   Financial Resource Strain: Not on file  Food Insecurity: Not on file  Transportation Needs: Not on file  Physical Activity: Not on file  Stress: Not on file  Social Connections: Not on file      Review of Systems  Constitutional: Negative for activity change, appetite change and fatigue.  HENT: Negative for congestion, sinus pain, trouble swallowing and voice change.   Eyes: Negative for pain, discharge and visual disturbance.  Respiratory: Negative for cough, chest tightness and shortness of breath.   Cardiovascular: Negative for chest pain and leg swelling.  Gastrointestinal: Negative for abdominal distention, abdominal pain, constipation and diarrhea.  Genitourinary: Negative for difficulty urinating, flank pain, penile discharge, testicular pain and urgency.  Musculoskeletal: Negative for arthralgias, back pain and neck pain.  Skin: Negative for color change.  Neurological: Negative for dizziness, weakness and headaches.  Hematological: Negative for adenopathy.  Psychiatric/Behavioral: Negative for agitation, confusion and suicidal ideas.     Vital Signs: BP 112/67 (BP Location: Left Arm, Patient Position: Sitting, Cuff Size: Normal)   Pulse 82   Temp 98.9 F (37.2 C) (Oral)   Resp 16   Ht $R'5\' 8"'Iy$  (1.727 m)   Wt 131  lb (59.4 kg)   SpO2 98%   BMI 19.92 kg/m    Physical Exam Constitutional:      Appearance: Normal appearance.  HENT:     Head: Normocephalic.     Right Ear: Tympanic membrane normal.     Left Ear: Tympanic membrane normal.     Nose: Nose normal.     Mouth/Throat:     Mouth: Mucous membranes are moist.     Pharynx: No oropharyngeal exudate or posterior oropharyngeal erythema.  Eyes:     General:        Right eye: No discharge.        Left eye: No discharge.     Extraocular Movements: Extraocular movements  intact.     Pupils: Pupils are equal, round, and reactive to light.  Cardiovascular:     Rate and Rhythm: Normal rate and regular rhythm.     Pulses: Normal pulses.     Heart sounds: Normal heart sounds. No murmur heard.   Pulmonary:     Effort: Pulmonary effort is normal. No respiratory distress.     Breath sounds: Normal breath sounds. No wheezing or rhonchi.  Abdominal:     General: Abdomen is flat. Bowel sounds are normal. There is no distension or abdominal bruit.     Palpations: Abdomen is soft. There is no hepatomegaly or mass.     Tenderness: There is no abdominal tenderness. There is no guarding.     Hernia: No hernia is present. There is no hernia in the left inguinal area or right inguinal area.  Genitourinary:    Pubic Area: No rash or pubic lice.      Penis: Circumcised. No phimosis or hypospadias.      Testes: Normal.        Right: Mass, tenderness or swelling not present.        Left: Mass, tenderness or swelling not present.     Epididymis:     Right: Normal.     Left: Normal.  Musculoskeletal:        General: No swelling or deformity. Normal range of motion.     Cervical back: Normal range of motion.  Lymphadenopathy:     Lower Body: No right inguinal adenopathy. No left inguinal adenopathy.  Skin:    General: Skin is warm and dry.     Capillary Refill: Capillary refill takes less than 2 seconds.  Neurological:     General: No focal deficit present.     Mental Status: He is alert.     Cranial Nerves: No cranial nerve deficit.     Gait: Gait normal.  Psychiatric:        Mood and Affect: Mood normal.        Behavior: Behavior normal.        Judgment: Judgment normal.      LABS: Recent Results (from the past 2160 hour(s))  CMP12+LP+TP+TSH+6AC+CBC/D/Plt     Status: Abnormal   Collection Time: 04/26/21 10:10 AM  Result Value Ref Range   Glucose 86 65 - 99 mg/dL   Uric Acid 3.5 (L) 3.8 - 8.4 mg/dL    Comment:            Therapeutic target for gout  patients: <6.0   BUN 16 6 - 20 mg/dL   Creatinine, Ser 0.93 0.76 - 1.27 mg/dL   eGFR 111 >59 mL/min/1.73   BUN/Creatinine Ratio 17 9 - 20   Sodium 141 134 - 144 mmol/L   Potassium 4.4 3.5 -  5.2 mmol/L   Chloride 104 96 - 106 mmol/L   Calcium 9.4 8.7 - 10.2 mg/dL   Phosphorus 3.1 2.8 - 4.1 mg/dL   Total Protein 6.8 6.0 - 8.5 g/dL   Albumin 4.7 4.0 - 5.0 g/dL   Globulin, Total 2.1 1.5 - 4.5 g/dL   Albumin/Globulin Ratio 2.2 1.2 - 2.2   Bilirubin Total 0.4 0.0 - 1.2 mg/dL   Alkaline Phosphatase 67 44 - 121 IU/L   LDH 130 121 - 224 IU/L   AST 23 0 - 40 IU/L   ALT 16 0 - 44 IU/L   GGT 20 0 - 65 IU/L   Iron 74 38 - 169 ug/dL   Cholesterol, Total 426 100 - 199 mg/dL   Triglycerides 69 0 - 149 mg/dL   HDL 67 >10 mg/dL   VLDL Cholesterol Cal 14 5 - 40 mg/dL   LDL Chol Calc (NIH) 53 0 - 99 mg/dL   Chol/HDL Ratio 2.0 0.0 - 5.0 ratio    Comment:                                   T. Chol/HDL Ratio                                             Men  Women                               1/2 Avg.Risk  3.4    3.3                                   Avg.Risk  5.0    4.4                                2X Avg.Risk  9.6    7.1                                3X Avg.Risk 23.4   11.0    Estimated CHD Risk  < 0.5 0.0 - 1.0 times avg.    Comment: The CHD Risk is based on the T. Chol/HDL ratio. Other factors affect CHD Risk such as hypertension, smoking, diabetes, severe obesity, and family history of premature CHD.    TSH 1.200 0.450 - 4.500 uIU/mL   T4, Total 7.8 4.5 - 12.0 ug/dL   T3 Uptake Ratio 24 24 - 39 %   Free Thyroxine Index 1.9 1.2 - 4.9   WBC 5.0 3.4 - 10.8 x10E3/uL   RBC 4.48 4.14 - 5.80 x10E6/uL   Hemoglobin 13.5 13.0 - 17.7 g/dL   Hematocrit 31.0 65.8 - 51.0 %   MCV 92 79 - 97 fL   MCH 30.1 26.6 - 33.0 pg   MCHC 32.6 31.5 - 35.7 g/dL   RDW 39.5 51.0 - 33.5 %   Platelets 181 150 - 450 x10E3/uL   Neutrophils 41 Not Estab. %   Lymphs 44 Not Estab. %   Monocytes 10 Not Estab. %    Eos 4 Not Estab. %  Basos 1 Not Estab. %   Neutrophils Absolute 2.1 1.4 - 7.0 x10E3/uL   Lymphocytes Absolute 2.2 0.7 - 3.1 x10E3/uL   Monocytes Absolute 0.5 0.1 - 0.9 x10E3/uL   EOS (ABSOLUTE) 0.2 0.0 - 0.4 x10E3/uL   Basophils Absolute 0.0 0.0 - 0.2 x10E3/uL   Immature Granulocytes 0 Not Estab. %   Immature Grans (Abs) 0.0 0.0 - 0.1 x10E3/uL     Assessment/Plan: 1. Annual physical exam Up to date on PHM. Discussed follow up in one year or sooner if new symptoms develop.     General Counseling: kymani shimabukuro understanding of the findings of todays visit and agrees with plan of treatment. I have discussed any further diagnostic evaluation that may be needed or ordered today. We also reviewed his medications today. he has been encouraged to call the office with any questions or concerns that should arise related to todays visit.    No orders of the defined types were placed in this encounter.   No orders of the defined types were placed in this encounter.   Total time spent: 40 Minutes  Time spent includes review of chart, medications, test results, and follow up plan with the patient.    Kendell Bane AGNP-C Nurse Practitioner

## 2021-09-20 DIAGNOSIS — Z0100 Encounter for examination of eyes and vision without abnormal findings: Secondary | ICD-10-CM | POA: Diagnosis not present

## 2021-09-20 DIAGNOSIS — H52223 Regular astigmatism, bilateral: Secondary | ICD-10-CM | POA: Diagnosis not present

## 2022-03-17 ENCOUNTER — Other Ambulatory Visit: Payer: Self-pay | Admitting: Physician Assistant

## 2022-03-17 ENCOUNTER — Other Ambulatory Visit: Payer: Self-pay

## 2022-03-17 DIAGNOSIS — Z1152 Encounter for screening for COVID-19: Secondary | ICD-10-CM

## 2022-03-17 LAB — POCT RAPID STREP A (OFFICE): Rapid Strep A Screen: POSITIVE — AB

## 2022-03-17 LAB — POCT INFLUENZA A/B
Influenza A, POC: NEGATIVE
Influenza B, POC: NEGATIVE

## 2022-03-17 LAB — POC COVID19 BINAXNOW: SARS Coronavirus 2 Ag: NEGATIVE

## 2022-03-17 MED ORDER — IBUPROFEN 800 MG PO TABS: 800.0000 mg | ORAL_TABLET | Freq: Three times a day (TID) | ORAL | 0 refills | Status: DC | PRN

## 2022-03-17 MED ORDER — PSEUDOEPH-BROMPHEN-DM 30-2-10 MG/5ML PO SYRP: 5.0000 mL | ORAL_SOLUTION | Freq: Four times a day (QID) | ORAL | 0 refills | Status: DC | PRN

## 2022-03-17 MED ORDER — AMOXICILLIN 875 MG PO TABS: 875.0000 mg | ORAL_TABLET | Freq: Two times a day (BID) | ORAL | 0 refills | Status: AC

## 2022-03-17 NOTE — Progress Notes (Signed)
Pt presents today with sore throat, drainage, body aches, chills, diarrhea for 3 days.  ?

## 2022-04-20 ENCOUNTER — Ambulatory Visit: Payer: Self-pay

## 2022-04-20 DIAGNOSIS — Z0000 Encounter for general adult medical examination without abnormal findings: Secondary | ICD-10-CM

## 2022-04-20 DIAGNOSIS — Z0110 Encounter for examination of ears and hearing without abnormal findings: Secondary | ICD-10-CM

## 2022-04-20 LAB — POCT URINALYSIS DIPSTICK
Bilirubin, UA: NEGATIVE
Blood, UA: NEGATIVE
Glucose, UA: NEGATIVE
Ketones, UA: NEGATIVE
Leukocytes, UA: NEGATIVE
Nitrite, UA: NEGATIVE
Protein, UA: NEGATIVE
Spec Grav, UA: 1.025 (ref 1.010–1.025)
Urobilinogen, UA: 0.2 E.U./dL
pH, UA: 5 (ref 5.0–8.0)

## 2022-04-20 NOTE — Progress Notes (Signed)
Pt presents today for physical labs, will return to clinic for scheduled physical.  

## 2022-04-20 NOTE — Progress Notes (Signed)
Presents to [facility] for baseline hearing screen.  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  The department is part of the [employer] program.  Nichlas 1st came to work 04/2016.  Copy of old hearing test is on paper chart in [facility].    Left employment 06/2020 but returned within 1 month.  Has not been to clinic for his annual hearing screens.  Today's test establishes a new baseline.  AMD

## 2022-04-21 LAB — CMP12+LP+TP+TSH+6AC+CBC/D/PLT
ALT: 14 IU/L (ref 0–44)
AST: 25 IU/L (ref 0–40)
Albumin/Globulin Ratio: 1.8 (ref 1.2–2.2)
Albumin: 4.2 g/dL (ref 4.0–5.0)
Alkaline Phosphatase: 68 IU/L (ref 44–121)
BUN/Creatinine Ratio: 16 (ref 9–20)
BUN: 15 mg/dL (ref 6–20)
Basophils Absolute: 0.1 10*3/uL (ref 0.0–0.2)
Basos: 1 %
Bilirubin Total: 0.3 mg/dL (ref 0.0–1.2)
Calcium: 9.3 mg/dL (ref 8.7–10.2)
Chloride: 104 mmol/L (ref 96–106)
Chol/HDL Ratio: 2.4 ratio (ref 0.0–5.0)
Cholesterol, Total: 151 mg/dL (ref 100–199)
Creatinine, Ser: 0.92 mg/dL (ref 0.76–1.27)
EOS (ABSOLUTE): 0.2 10*3/uL (ref 0.0–0.4)
Eos: 3 %
Estimated CHD Risk: <0.5 times avg. (ref 0.0–1.0)
Free Thyroxine Index: 1.5 (ref 1.2–4.9)
GGT: 14 IU/L (ref 0–65)
Globulin, Total: 2.4 g/dL (ref 1.5–4.5)
Glucose: 94 mg/dL (ref 70–99)
HDL: 62 mg/dL (ref >39)
Hematocrit: 37.9 % (ref 37.5–51.0)
Hemoglobin: 12.8 g/dL — ABNORMAL LOW (ref 13.0–17.7)
Immature Grans (Abs): 0 10*3/uL (ref 0.0–0.1)
Immature Granulocytes: 0 %
Iron: 75 ug/dL (ref 38–169)
LDH: 136 IU/L (ref 121–224)
LDL Chol Calc (NIH): 71 mg/dL (ref 0–99)
Lymphocytes Absolute: 2 10*3/uL (ref 0.7–3.1)
Lymphs: 34 %
MCH: 30.1 pg (ref 26.6–33.0)
MCHC: 33.8 g/dL (ref 31.5–35.7)
MCV: 89 fL (ref 79–97)
Monocytes Absolute: 0.3 10*3/uL (ref 0.1–0.9)
Monocytes: 6 %
Neutrophils Absolute: 3.2 10*3/uL (ref 1.4–7.0)
Neutrophils: 56 %
Phosphorus: 3.4 mg/dL (ref 2.8–4.1)
Platelets: 150 10*3/uL (ref 150–450)
Potassium: 4.4 mmol/L (ref 3.5–5.2)
RBC: 4.25 x10E6/uL (ref 4.14–5.80)
RDW: 13 % (ref 11.6–15.4)
Sodium: 140 mmol/L (ref 134–144)
T3 Uptake Ratio: 23 % — ABNORMAL LOW (ref 24–39)
T4, Total: 6.4 ug/dL (ref 4.5–12.0)
TSH: 1.51 u[IU]/mL (ref 0.450–4.500)
Total Protein: 6.6 g/dL (ref 6.0–8.5)
Triglycerides: 98 mg/dL (ref 0–149)
Uric Acid: 4.7 mg/dL (ref 3.8–8.4)
VLDL Cholesterol Cal: 18 mg/dL (ref 5–40)
WBC: 5.8 10*3/uL (ref 3.4–10.8)
eGFR: 111 mL/min/{1.73_m2} (ref >59)

## 2022-04-24 NOTE — Addendum Note (Signed)
Addended by: Malachy Moan F on: 04/24/2022 04:28 PM   Modules accepted: Orders

## 2022-04-25 ENCOUNTER — Encounter: Payer: Self-pay | Admitting: Physician Assistant

## 2022-04-25 ENCOUNTER — Ambulatory Visit: Payer: Self-pay | Admitting: Physician Assistant

## 2022-04-25 VITALS — BP 124/77 | HR 63 | Temp 97.6°F | Resp 12 | Ht 68.0 in | Wt 135.0 lb

## 2022-04-25 DIAGNOSIS — Z0000 Encounter for general adult medical examination without abnormal findings: Secondary | ICD-10-CM

## 2022-04-25 DIAGNOSIS — J302 Other seasonal allergic rhinitis: Secondary | ICD-10-CM

## 2022-04-25 MED ORDER — FLUTICASONE PROPIONATE 50 MCG/ACT NA SUSP: 2.0000 | Freq: Every day | NASAL | 6 refills | Status: DC

## 2022-04-25 MED ORDER — CETIRIZINE HCL 10 MG PO TABS: 10.0000 mg | ORAL_TABLET | Freq: Every day | ORAL | 6 refills | Status: DC

## 2022-04-25 NOTE — Progress Notes (Signed)
[facility]   ____________________________________________   None    (approximate)  I have reviewed the triage vital signs and the nursing notes.   HISTORY  Chief Complaint Annual Exam    HPI Tyler Oneill is a 36 y.o. male patient presents for annual physical.  Patient works for city [employer] and [employer] out the wall environment.  States seasonal allergies.         Past Medical History:  Diagnosis Date   Allergy    Phreesia 08/14/2020   Seasonal allergies     Patient Active Problem List   Diagnosis Date Noted   Low iron 04/26/2021   Headache disorder 03/20/2019    History reviewed. No pertinent surgical history.  Prior to Admission medications   Medication Sig Start Date End Date Taking? Authorizing Provider  acetaminophen (TYLENOL) 500 MG tablet Take 1,000 mg by mouth every 6 (six) hours.   Yes [provider]  fluticasone (FLONASE) 50 MCG/ACT nasal spray Place 2 sprays into the nose daily.   Yes [provider]  ibuprofen (ADVIL) 800 MG tablet Take 1 tablet (800 mg total) by mouth every 8 (eight) hours as needed for moderate pain. 03/17/22  Yes Sable Feil, PA-C    Allergies Other and Nortriptyline  Family History  Problem Relation Age of Onset   Diabetes Father    Diabetes Maternal Grandmother    Diabetes Paternal Grandmother     Social History Social History   Tobacco Use   Smoking status: Former    Types: Cigarettes    Quit date: 12/27/2018    Years since quitting: 3.3   Smokeless tobacco: Never  Substance Use Topics   Alcohol use: No   Drug use: No    Review of Systems Constitutional: No fever/chills Eyes: No visual changes. ENT: No sore throat. Cardiovascular: Denies chest pain. Respiratory: Denies shortness of breath. Gastrointestinal: No abdominal pain.  No nausea, no vomiting.  No diarrhea.  No constipation. Genitourinary: Negative for dysuria. Musculoskeletal:  Negative for back pain. Skin: Negative for rash. Neurological: Negative for headaches, focal weakness or numbness.  ____________________________________________   PHYSICAL EXAM:  VITAL SIGNS: BP is 124/77, pulse 63, respiration 12, temperature 97.6, patient 97% O2 sat on room air.  Patient weighs 135 pounds. Constitutional: Alert and oriented. Well appearing and in no acute distress. Eyes: Conjunctivae are normal. PERRL. EOMI. Head: Atraumatic. Nose: No congestion/rhinnorhea. Mouth/Throat: Mucous membranes are moist.  Oropharynx non-erythematous. Neck: No stridor.  No cervical spine tenderness to palpation. Hematological/Lymphatic/Immunilogical: No cervical lymphadenopathy. Cardiovascular: Normal rate, regular rhythm. Grossly normal heart sounds.  Good peripheral circulation. Respiratory: Normal respiratory effort.  No retractions. Lungs CTAB. Gastrointestinal: Soft and nontender. No distention. No abdominal bruits. No CVA tenderness. Genitourinary: Deferred Musculoskeletal: No lower extremity tenderness nor edema.  No joint effusions. Neurologic:  Normal speech and language. No gross focal neurologic deficits are appreciated. No gait instability. Skin:  Skin is warm, dry and intact. No rash noted. Psychiatric: Mood and affect are normal. Speech and behavior are normal.  ____________________________________________   LABS _     Component Ref Range & Units 5 d ago  Color, UA  dark yellow   Clarity, UA  clear   Glucose, UA Negative Negative   Bilirubin, UA  negative   Ketones, UA  negative   Spec Grav, UA 1.010 - 1.025 1.025   Blood, UA  negative   pH, UA 5.0 - 8.0 5.0   Protein, UA Negative  Negative   Urobilinogen, UA 0.2 or 1.0 E.U./dL 0.2   Nitrite, UA  negative   Leukocytes, UA Negative Negative   Appearance    Odor                    Other Results from 04/20/2022   Contains abnormal data CMP12+LP+TP+TSH+6AC+CBC/D/Plt Order: [id number] Status: Final result     Visible to patient: Yes (not seen)    Next appt: None    Dx: Routine adult health maintenance    0 Result Notes         Component Ref Range & Units 5 d ago (04/20/22) 12 mo ago (04/26/21) 3 yr ago (04/01/19) 3 yr ago (04/01/19) 11 yr ago (11/28/10)  Glucose 70 - 99 mg/dL 94  86 R  120 High    79   Uric Acid 3.8 - 8.4 mg/dL 4.7  3.5 Low  CM      Comment:            Therapeutic target for gout patients: <6.0  BUN 6 - 20 mg/dL _0 High  R   Creatinine, Ser 0.76 - 1.27 mg/dL 0.92  0.93  0.91 R   1.2 R   eGFR >59 mL/min/1.73 111  111      BUN/Creatinine Ratio 9 - _1 Sodium 134 - 144 mmol/L 140  141  137 R   135 R   Potassium 3.5 - 5.2 mmol/L 4.4  4.4  3.8 R   4.4 R   Chloride 96 - 106 mmol/L 104  104  103 R   99 R   Calcium 8.7 - 10.2 mg/dL 9.3  9.4  9.2 R     Phosphorus 2.8 - 4.1 mg/dL 3.4  3.1      Total Protein 6.0 - 8.5 g/dL 6.6  6.8      Albumin 4.0 - 5.0 g/dL 4.2  4.7      Globulin, Total 1.5 - 4.5 g/dL 2.4  2.1      Albumin/Globulin Ratio 1.2 - 2.2 1.8  2.2      Bilirubin Total 0.0 - 1.2 mg/dL 0.3  0.4      Alkaline Phosphatase 44 - 121 IU/L 68  67      LDH 121 - 224 IU/L 136  130      AST 0 - 40 IU/L 25  23      ALT 0 - 44 IU/L 14  16      GGT 0 - 65 IU/L 14  20      Iron 38 - 169 ug/dL 75  74      Cholesterol, Total 100 - 199 mg/dL 151  134      Triglycerides 0 - 149 mg/dL 98  69      HDL >39 mg/dL 62  67      VLDL Cholesterol Cal 5 - 40 mg/dL 18  14      LDL Chol Calc (NIH) 0 - 99 mg/dL 71  53      Chol/HDL Ratio 0.0 - 5.0 ratio 2.4  2.0 CM      Comment:                                   T. Chol/HDL Ratio  Men  Women                                1/2 Avg.Risk  3.4    3.3                                    Avg.Risk  5.0    4.4                                 2X Avg.Risk  9.6    7.1                                 3X Avg.Risk 23.4   11.0   Estimated CHD Risk 0.0 - 1.0 times avg.  < 0.5   < 0.5 CM       Comment: The CHD Risk is based on the T. Chol/HDL ratio. Other  factors affect CHD Risk such as hypertension, smoking,  diabetes, severe obesity, and family history of  premature CHD.   TSH 0.450 - 4.500 uIU/mL 1.510  1.200      T4, Total 4.5 - 12.0 ug/dL 6.4  7.8      T3 Uptake Ratio 24 - 39 % 23 Low   24      Free Thyroxine Index 1.2 - 4.9 1.5  1.9      WBC 3.4 - 10.8 x10E3/uL 5.8  5.0   7.3 R    RBC 4.14 - 5.80 x10E6/uL 4.25  4.48   4.67 R    Hemoglobin 13.0 - 17.7 g/dL 12.8 Low   13.5   14.1 R  16.0 R   Hematocrit 37.5 - 51.0 % 37.9  41.4   42.0 R  47.0 R   MCV 79 - 97 fL 89  92   89.9 R    MCH 26.6 - 33.0 pg 30.1  30.1   30.2 R    MCHC 31.5 - 35.7 g/dL 33.8  32.6   33.6 R    RDW 11.6 - 15.4 % 13.0  13.1   14.2 R    Platelets 150 - 450 x10E3/uL 150  181   179 R    Neutrophils Not Estab. % 56  41      Lymphs Not Estab. % 34  44      Monocytes Not Estab. % 6  10      Eos Not Estab. % 3  4      Basos Not Estab. % 1  1      Neutrophils Absolute 1.4 - 7.0 x10E3/uL 3.2  2.1      Lymphocytes Absolute 0.7 - 3.1 x10E3/uL 2.0  2.2      Monocytes Absolute 0.1 - 0.9 x10E3/uL 0.3  0.5      EOS (ABSOLUTE) 0.0 - 0.4 x10E3/uL 0.2  0.2      Basophils Absolute 0.0 - 0.2 x10E3/uL 0.1  0.0      Immature Granulocytes Not Estab. % 0  0      Immature Grans            ___________________________________________   ____________________________________________     ____________________________________________   INITIAL IMPRESSION / ASSESSMENT AND PLAN As part of my medical decision making, I  reviewed the following data within the [place]      Discussed lab results with patient.  Patient given a prescription for Flonase and Zyrtec's.        ____________________________________________   FINAL CLINICAL IMPRESSION  Well exam   ED Discharge Orders     None        Note:  This document was prepared using Dragon voice recognition software and may include  unintentional dictation errors.

## 2022-04-25 NOTE — Progress Notes (Signed)
Pt presents today to complete physical, Pt denies any issues or concerns at this time/CL,RMA 

## 2022-07-04 ENCOUNTER — Ambulatory Visit: Payer: Self-pay | Admitting: Physician Assistant

## 2022-07-04 ENCOUNTER — Encounter: Payer: Self-pay | Admitting: Physician Assistant

## 2022-07-04 VITALS — BP 100/67 | HR 60 | Temp 98.0°F | Resp 12 | Ht 68.0 in | Wt 135.0 lb

## 2022-07-04 DIAGNOSIS — L740 Miliaria rubra: Secondary | ICD-10-CM

## 2022-07-04 MED ORDER — HYDROXYZINE PAMOATE 25 MG PO CAPS: 25.0000 mg | ORAL_CAPSULE | Freq: Three times a day (TID) | ORAL | 0 refills | Status: DC | PRN

## 2022-07-04 MED ORDER — CLOBETASOL PROPIONATE 0.05 % EX CREA: 1.0000 | TOPICAL_CREAM | Freq: Two times a day (BID) | CUTANEOUS | 0 refills | Status: DC

## 2022-07-04 NOTE — Progress Notes (Signed)
Rash came up two weeks ago  Started putting Tecnu because he though it was a bug bite.  Only used for 1st couple of days & hasn't used anything else. Takes Flonase everyday.  Burning sensation - intermittent Itching  No changes in hygiene products or household cleaning products Hasn't been in the woods No recent travel & staying in hotel rooms or anyone else's home No other family members affected  AMD

## 2022-07-04 NOTE — Progress Notes (Signed)
   Subjective: Rash    Patient ID: Tyler Oneill, male    DOB: December 19, 1985, 36 y.o.   MRN: [id number]  HPI  Patient complain pruritic rash with papular lesions for 2 weeks.  Patient works outdoors in the past 3 weeks has been extreme heat conditions.  Patient stated no relief with over-the-counter medications.  Review of Systems Negative septa chief complaint    Objective:   Physical Exam  BP is 100/67, pulse 60, respiration 12, temperature 98, and patient is 98% O2 sat on room air.  Patient weighs 135 pounds and BMI is 20.53. Patient has diffuse papular lesions on erythematous base mostly in the ways and lower extremities.     Assessment & Plan: Heat rash   Patient given a prescription for Periactin and Temovate.  Patient also given discharge care instruction advised to follow-up as necessary.

## 2022-07-18 ENCOUNTER — Ambulatory Visit: Payer: Self-pay | Admitting: Physician Assistant

## 2022-07-18 ENCOUNTER — Encounter: Payer: Self-pay | Admitting: Physician Assistant

## 2022-07-18 VITALS — BP 112/78 | HR 66 | Temp 98.4°F | Resp 16

## 2022-07-18 DIAGNOSIS — H9312 Tinnitus, left ear: Secondary | ICD-10-CM

## 2022-07-18 NOTE — Progress Notes (Signed)
C/O ringing in ears that woke him up last night and intermittent in left ear.  No stated hearing loss and stated recent hearing exam and states compliant with ear protection.  Taking some PRN sudafed and stated he was told he was anemic and read online and concerned about anemia and ear ringing.

## 2022-07-18 NOTE — Progress Notes (Signed)
   Subjective: Tinnitus    Patient ID: Tyler Oneill, male    DOB: 03/10/1986, 36 y.o.   MRN: [id number]  HPI Patient stated waking about 3:00 this morning with ringing in his ear.  Patient stated unable to go back to sleep secondary to complaint.  Patient stated he took an extra strength Sudafed and by the time he went to work condition have resolved.  Patient denies hearing loss or vertigo.  Patient states he was using a noise producing machinery yesterday at work but wore ear protection.  Patient also states after work he cleaned his ear using Q-tips.  Patient said he went on the Internet and saw that anemia can cause vertigo.  Review of patient record shows that his hemoglobin was 12.8 and his hematocrit was 37.9.  Patient iron levels were 75.   Review of Systems Negative except for above complaint    Objective:   Physical Exam No acute distress.  BP is 112/78, pulse 66, respirations 16, temperature 98.4, patient 94% O2 sat on room air. HEENT was unremarkable.  Particular attention was paid to the ears show no edema or erythema of the canal or tympanic membrane.  Patient passed whisper test.  Negative Romberg.       Assessment & Plan: Transit tinnitus  Patient reassured no acute process at this time.  Advised to monitor condition follow-up as needed.

## 2022-09-22 DIAGNOSIS — M67431 Ganglion, right wrist: Secondary | ICD-10-CM | POA: Diagnosis not present

## 2022-10-23 ENCOUNTER — Other Ambulatory Visit: Payer: Self-pay | Admitting: Physician Assistant

## 2022-10-30 ENCOUNTER — Other Ambulatory Visit: Payer: Self-pay

## 2022-10-30 DIAGNOSIS — L740 Miliaria rubra: Secondary | ICD-10-CM

## 2022-10-30 MED ORDER — CLOBETASOL PROPIONATE 0.05 % EX CREA: 1.0000 | TOPICAL_CREAM | Freq: Two times a day (BID) | CUTANEOUS | 0 refills | Status: DC

## 2023-01-18 ENCOUNTER — Encounter: Payer: Self-pay | Admitting: Physician Assistant

## 2023-01-18 ENCOUNTER — Ambulatory Visit: Payer: Self-pay | Admitting: Physician Assistant

## 2023-01-18 VITALS — BP 130/75 | HR 60 | Temp 98.6°F | Resp 14

## 2023-01-18 DIAGNOSIS — R0981 Nasal congestion: Secondary | ICD-10-CM

## 2023-01-18 DIAGNOSIS — H6501 Acute serous otitis media, right ear: Secondary | ICD-10-CM

## 2023-01-18 MED ORDER — METHYLPREDNISOLONE 4 MG PO TBPK: ORAL_TABLET | ORAL | 0 refills | Status: DC

## 2023-01-18 MED ORDER — AMOXICILLIN 875 MG PO TABS: 875.0000 mg | ORAL_TABLET | Freq: Two times a day (BID) | ORAL | 0 refills | Status: AC

## 2023-01-18 MED ORDER — FEXOFENADINE-PSEUDOEPHED ER 60-120 MG PO TB12: 1.0000 | ORAL_TABLET | Freq: Two times a day (BID) | ORAL | 0 refills | Status: DC

## 2023-01-18 NOTE — Progress Notes (Signed)
Stated 2 days ago his right ear started bothering him with pain working outside in wind and then feels sinus congestion.  No fever and no cough.  Respirations quiet and unlabored.  Tried Sudafed twice a few days ago,but not taking meds at this time.

## 2023-01-18 NOTE — Progress Notes (Signed)
   Sinus congestion and left ear pain    Patient ID: Tyler Oneill, male    DOB: Feb 23, 1986, 37 y.o.   MRN: [id number]  HPI Patient complain of sinus congestion right ear pain for 4 days.  Patient states intermitting hearing loss in the right t ear.  Denies problems with the left ear.  Denies vertigo.  Denies fever chills associated complaint.  Denies cough or sore throat.   Review of Systems Negative except for chief complaint    Objective:   Physical Exam  BP is 130/75, pulse 60, respiration 14, temperature 90.6, patient 1% O2 sat on room air. HEENT is remarkable for bilateral edematous nasal turbinates.  Maxillary guarding right greater than left.  Right TM is erythematous and edematous. Neck is supple followed lymphadenopathy or bruits. Lungs are clear to auscultation. Heart regular rate and rhythm.      Assessment & Plan: Sinus congestion otitis media

## 2023-02-08 ENCOUNTER — Ambulatory Visit: Payer: Self-pay

## 2023-02-08 DIAGNOSIS — Z0000 Encounter for general adult medical examination without abnormal findings: Secondary | ICD-10-CM

## 2023-02-08 LAB — POCT URINALYSIS DIPSTICK
Appearance: NORMAL
Bilirubin, UA: NEGATIVE
Blood, UA: NEGATIVE
Glucose, UA: NEGATIVE
Ketones, UA: NEGATIVE
Leukocytes, UA: NEGATIVE
Nitrite, UA: NEGATIVE
Protein, UA: NEGATIVE
Spec Grav, UA: 1.025 (ref 1.010–1.025)
Urobilinogen, UA: 0.2 E.U./dL
pH, UA: 6 (ref 5.0–8.0)

## 2023-02-08 NOTE — Progress Notes (Signed)
Pt presents today to complete lab portion of physical. Burna Sis

## 2023-02-09 LAB — CMP12+LP+TP+TSH+6AC+CBC/D/PLT
ALT: 18 IU/L (ref 0–44)
AST: 20 IU/L (ref 0–40)
Albumin/Globulin Ratio: 2.3 — ABNORMAL HIGH (ref 1.2–2.2)
Albumin: 4.8 g/dL (ref 4.1–5.1)
Alkaline Phosphatase: 65 IU/L (ref 44–121)
BUN/Creatinine Ratio: 16 (ref 9–20)
BUN: 15 mg/dL (ref 6–20)
Basophils Absolute: 0.1 10*3/uL (ref 0.0–0.2)
Basos: 1 %
Bilirubin Total: 0.3 mg/dL (ref 0.0–1.2)
Calcium: 9.6 mg/dL (ref 8.7–10.2)
Chloride: 104 mmol/L (ref 96–106)
Chol/HDL Ratio: 2.2 ratio (ref 0.0–5.0)
Cholesterol, Total: 155 mg/dL (ref 100–199)
Creatinine, Ser: 0.94 mg/dL (ref 0.76–1.27)
EOS (ABSOLUTE): 0.2 10*3/uL (ref 0.0–0.4)
Eos: 3 %
Estimated CHD Risk: <0.5 times avg. (ref 0.0–1.0)
Free Thyroxine Index: 1.8 (ref 1.2–4.9)
GGT: 22 IU/L (ref 0–65)
Globulin, Total: 2.1 g/dL (ref 1.5–4.5)
Glucose: 94 mg/dL (ref 70–99)
HDL: 72 mg/dL (ref >39)
Hematocrit: 39.5 % (ref 37.5–51.0)
Hemoglobin: 13.6 g/dL (ref 13.0–17.7)
Immature Grans (Abs): 0 10*3/uL (ref 0.0–0.1)
Immature Granulocytes: 0 %
Iron: 66 ug/dL (ref 38–169)
LDH: 146 IU/L (ref 121–224)
LDL Chol Calc (NIH): 67 mg/dL (ref 0–99)
Lymphocytes Absolute: 1.4 10*3/uL (ref 0.7–3.1)
Lymphs: 28 %
MCH: 30.6 pg (ref 26.6–33.0)
MCHC: 34.4 g/dL (ref 31.5–35.7)
MCV: 89 fL (ref 79–97)
Monocytes Absolute: 0.3 10*3/uL (ref 0.1–0.9)
Monocytes: 7 %
Neutrophils Absolute: 3.1 10*3/uL (ref 1.4–7.0)
Neutrophils: 61 %
Phosphorus: 3.5 mg/dL (ref 2.8–4.1)
Platelets: 182 10*3/uL (ref 150–450)
Potassium: 3.9 mmol/L (ref 3.5–5.2)
RBC: 4.44 x10E6/uL (ref 4.14–5.80)
RDW: 13.3 % (ref 11.6–15.4)
Sodium: 143 mmol/L (ref 134–144)
T3 Uptake Ratio: 25 % (ref 24–39)
T4, Total: 7.3 ug/dL (ref 4.5–12.0)
TSH: 1.57 u[IU]/mL (ref 0.450–4.500)
Total Protein: 6.9 g/dL (ref 6.0–8.5)
Triglycerides: 86 mg/dL (ref 0–149)
Uric Acid: 4.1 mg/dL (ref 3.8–8.4)
VLDL Cholesterol Cal: 16 mg/dL (ref 5–40)
WBC: 5.1 10*3/uL (ref 3.4–10.8)
eGFR: 108 mL/min/{1.73_m2} (ref >59)

## 2023-02-12 ENCOUNTER — Ambulatory Visit: Payer: Self-pay | Admitting: Physician Assistant

## 2023-02-12 ENCOUNTER — Other Ambulatory Visit: Payer: Self-pay | Admitting: Physician Assistant

## 2023-02-12 ENCOUNTER — Encounter: Payer: Self-pay | Admitting: Physician Assistant

## 2023-02-12 VITALS — BP 120/69 | HR 68 | Temp 97.8°F | Resp 12 | Ht 68.0 in | Wt 137.0 lb

## 2023-02-12 DIAGNOSIS — Z0000 Encounter for general adult medical examination without abnormal findings: Secondary | ICD-10-CM

## 2023-02-12 DIAGNOSIS — L740 Miliaria rubra: Secondary | ICD-10-CM

## 2023-02-12 MED ORDER — CLOBETASOL PROPIONATE 0.05 % EX CREA: 1.0000 | TOPICAL_CREAM | Freq: Two times a day (BID) | CUTANEOUS | 0 refills | Status: DC

## 2023-02-12 MED ORDER — FEXOFENADINE-PSEUDOEPHED ER 60-120 MG PO TB12: 1.0000 | ORAL_TABLET | Freq: Two times a day (BID) | ORAL | 3 refills | Status: DC

## 2023-02-12 NOTE — Progress Notes (Signed)
[facility]     l ____________________________________________   None    (approximate)  I have reviewed the triage vital signs and the nursing notes.   HISTORY  Chief Complaint No chief complaint on file.   HPI Tyler Oneill is a 37 y.o. male that presents for annual physical exam. Patient appears in good health and reports no concerns or complaints. Requested refills for Allegra-D and Temovate.       Past Medical History:  Diagnosis Date   Allergy    Phreesia 08/14/2020   Seasonal allergies     Patient Active Problem List   Diagnosis Date Noted   Low iron 04/26/2021   Headache disorder 03/20/2019    No past surgical history on file.  Prior to Admission medications   Medication Sig Start Date End Date Taking? Authorizing Provider  acetaminophen (TYLENOL) 500 MG tablet Take 1,000 mg by mouth every 6 (six) hours. Patient not taking: Reported on 07/18/2022    [provider]  clobetasol cream (TEMOVATE) [id number] % Apply 1 Application topically 2 (two) times daily. Patient not taking: Reported on 01/18/2023 10/30/22   Sable Feil, PA-C  fexofenadine-pseudoephedrine (ALLEGRA-D) 60-120 MG 12 hr tablet Take 1 tablet by mouth 2 (two) times daily. 01/18/23   Sable Feil, PA-C  fluticasone (FLONASE) 50 MCG/ACT nasal spray Place 2 sprays into both nostrils daily. 04/25/22   Sable Feil, PA-C  methylPREDNISolone (MEDROL DOSEPAK) 4 MG TBPK tablet Take Tapered dose as directed 01/18/23   Sable Feil, PA-C    Allergies Other and Nortriptyline  Family History  Problem Relation Age of Onset   Diabetes Father    Diabetes Maternal Grandmother    Diabetes Paternal Grandmother     Social History Social History   Tobacco Use   Smoking status: Former    Types: Cigarettes    Quit date: 12/27/2018    Years since quitting: 4.1   Smokeless tobacco: Never  Substance Use Topics   Alcohol use: No   Drug use: No     Review of Systems Constitutional: No fever/chills Eyes: No visual changes. ENT: No sore throat. Cardiovascular: Denies chest pain. Respiratory: Denies shortness of breath. Gastrointestinal: No abdominal pain.  No nausea, no vomiting.  No diarrhea.  No constipation. Genitourinary: Negative for dysuria. Musculoskeletal: Negative for back pain. Skin: Negative for rash. Neurological: Negative for headaches, focal weakness or numbness. ____________________________________________   PHYSICAL EXAM:  VITAL SIGNS: BP 120/69, P 68, T 97.95F, SpO2 98%, Wt 137lb, BMI 20.8 Constitutional: Alert and oriented. Well appearing and in no acute distress. Eyes: Conjunctivae are normal. PERRL. EOMI. Head: Atraumatic. Nose: No congestion/rhinnorhea. Mouth/Throat: Mucous membranes are moist.  Oropharynx non-erythematous. Neck: No stridor. No cervical spine tenderness to palpation Hematological/Lymphatic/Immunilogical: No cervical lymphadenopathy. Cardiovascular: Normal rate, regular rhythm. Grossly normal heart sounds.  Good peripheral circulation. Respiratory: Normal respiratory effort.  No retractions. Lungs CTAB. Gastrointestinal: Soft and nontender. No distention. No abdominal bruits. No CVA tenderness. Musculoskeletal: No lower extremity tenderness nor edema.  No joint effusions. Neurologic:  Normal speech and language. No gross focal neurologic deficits are appreciated. No gait instability. Skin:  Skin is warm, dry and intact. No rash noted. Psychiatric: Mood and affect are normal. Speech and behavior are normal.  ____________________________________________   LABS __      Component Ref Range & Units 4 d ago 9 mo ago  Color, UA  yellow dark yellow  Clarity, UA  clear clear  Glucose,  UA Negative Negative Negative  Bilirubin, UA  neg negative  Ketones, UA  neg negative  Spec Grav, UA 1.010 - 1.025 1.025 1.025  Blood, UA  neg negative  pH, UA 5.0 - 8.0 6.0 5.0  Protein, UA Negative  Negative Negative  Urobilinogen, UA 0.2 or 1.0 E.U./dL 0.2 0.2  Nitrite, UA  neg negative  Leukocytes, UA Negative Negative Negative  Appearance  normal   Odor            Specimen Collected: 02/08/23 09:34 Last Resulted: 02/08/23 09:34      Lab Flowsheet      Order Details      View Encounter      Lab and Collection Details      Routing      Result History    View All Conversations on this Encounter        Result Care Coordination   Patient Communication   Add Comments   Add Notifications  Back to Top      Other Results from 02/08/2023   Contains abnormal data CMP12+LP+TP+TSH+6AC+CBC/D/Plt Order: [id number] Status: Final result      Visible to patient: Yes (not seen)      Next appt: None      Dx: Routine adult health maintenance    0 Result Notes             Component Ref Range & Units 4 d ago (02/08/23) 9 mo ago (04/20/22) 1 yr ago (04/26/21) 3 yr ago (04/01/19) 3 yr ago (04/01/19) 12 yr ago (11/28/10)  Glucose 70 - 99 mg/dL 94 94 86 R 120 High   79  Uric Acid 3.8 - 8.4 mg/dL 4.1 4.7 CM 3.5 Low  CM     Comment:            Therapeutic target for gout patients: <6.0  BUN 6 - 20 mg/dL '15 15 16 13  26 '$ High  R  Creatinine, Ser 0.76 - 1.27 mg/dL 0.94 0.92 0.93 0.91 R  1.2 R  eGFR >59 mL/min/1.73 108 111 111     BUN/Creatinine Ratio 9 - '20 16 16 17     '$ Sodium 134 - 144 mmol/L 143 140 141 137 R  135 R  Potassium 3.5 - 5.2 mmol/L 3.9 4.4 4.4 3.8 R  4.4 R  Chloride 96 - 106 mmol/L 104 104 104 103 R  99 R  Calcium 8.7 - 10.2 mg/dL 9.6 9.3 9.4 9.2 R    Phosphorus 2.8 - 4.1 mg/dL 3.5 3.4 3.1     Total Protein 6.0 - 8.5 g/dL 6.9 6.6 6.8     Albumin 4.1 - 5.1 g/dL 4.8 4.2 R 4.7 R     Globulin, Total 1.5 - 4.5 g/dL 2.1 2.4 2.1     Albumin/Globulin Ratio 1.2 - 2.2 2.3 High  1.8 2.2     Bilirubin Total 0.0 - 1.2 mg/dL 0.3 0.3 0.4     Alkaline Phosphatase 44 - 121 IU/L 65 68 67     LDH 121 - 224 IU/L 146 136 130     AST 0 - 40 IU/L '20 25 23     '$ ALT 0 - 44 IU/L '18  14 16     '$ GGT 0 - 65 IU/L '22 14 20     '$ Iron 38 - 169 ug/dL 66 75 74     Cholesterol, Total 100 - 199 mg/dL 155 151 134     Triglycerides 0 - 149 mg/dL 86 98 69  HDL >39 mg/dL 72 62 67     VLDL Cholesterol Cal 5 - 40 mg/dL '16 18 14     '$ LDL Chol Calc (NIH) 0 - 99 mg/dL 67 71 53     Chol/HDL Ratio 0.0 - 5.0 ratio 2.2 2.4 CM 2.0 CM     Comment:                                   T. Chol/HDL Ratio                                             Men  Women                               1/2 Avg.Risk  3.4    3.3                                   Avg.Risk  5.0    4.4                                2X Avg.Risk  9.6    7.1                                3X Avg.Risk 23.4   11.0  Estimated CHD Risk 0.0 - 1.0 times avg.  < 0.5  < 0.5 CM  < 0.5 CM     Comment: The CHD Risk is based on the T. Chol/HDL ratio. Other factors affect CHD Risk such as hypertension, smoking, diabetes, severe obesity, and family history of premature CHD.  TSH 0.450 - 4.500 uIU/mL 1.570 1.510 1.200     T4, Total 4.5 - 12.0 ug/dL 7.3 6.4 7.8     T3 Uptake Ratio 24 - 39 % 25 23 Low  24     Free Thyroxine Index 1.2 - 4.9 1.8 1.5 1.9     WBC 3.4 - 10.8 x10E3/uL 5.1 5.8 5.0  7.3 R   RBC 4.14 - 5.80 x10E6/uL 4.44 4.25 4.48  4.67 R   Hemoglobin 13.0 - 17.7 g/dL 13.6 12.8 Low  13.5  14.1 R 16.0 R  Hematocrit 37.5 - 51.0 % 39.5 37.9 41.4  42.0 R 47.0 R  MCV 79 - 97 fL 89 89 92  89.9 R   MCH 26.6 - 33.0 pg 30.6 30.1 30.1  30.2 R   MCHC 31.5 - 35.7 g/dL 34.4 33.8 32.6  33.6 R   RDW 11.6 - 15.4 % 13.3 13.0 13.1  14.2 R   Platelets 150 - 450 x10E3/uL 182 150 181  179 R   Neutrophils Not Estab. % 61 56 41     Lymphs Not Estab. % 28 34 44     Monocytes Not Estab. % '7 6 10     '$ Eos Not Estab. % '3 3 4     '$ Basos Not Estab. % '1 1 1     '$ Neutrophils Absolute 1.4 - 7.0 x10E3/uL 3.1 3.2 2.1     Lymphocytes Absolute 0.7 - 3.1 x10E3/uL 1.4 2.0 2.2  Monocytes Absolute 0.1 - 0.9 x10E3/uL 0.3 0.3 0.5     EOS (ABSOLUTE) 0.0 - 0.4 x10E3/uL 0.2  0.2 0.2     Basophils Absolute 0.0 - 0.2 x10E3/uL 0.1 0.1 0.0     Immature Granulocytes Not Estab. % 0 0 0                  __________________________________________   ____________________________________________   INITIAL IMPRESSION / ASSESSMENT AND PLAN As part of my medical decision making, I reviewed the following data within the electronic MEDICAL RECORD NUMBER   No acute findings in lab work. Refills sent for Allegra-D & Temovate.          ____________________________________________   FINAL CLINICAL IMPRESSION  Well visit  ED Discharge Orders     None        Note:  This document was prepared using Dragon voice recognition software and may include unintentional dictation errors.

## 2023-03-05 ENCOUNTER — Encounter: Payer: Self-pay | Admitting: Physician Assistant

## 2023-03-05 ENCOUNTER — Ambulatory Visit: Payer: Self-pay | Admitting: Physician Assistant

## 2023-03-05 VITALS — BP 117/78 | HR 62 | Temp 97.5°F | Resp 12

## 2023-03-05 DIAGNOSIS — B078 Other viral warts: Secondary | ICD-10-CM

## 2023-03-05 NOTE — Progress Notes (Signed)
?   Side effect to using prescribed cream for rash to left inner thigh.  AMD

## 2023-03-05 NOTE — Progress Notes (Signed)
   Subjective: Papular lesion left     Patient ID: Tyler Oneill, male    DOB: 1985-12-20, 37 y.o.   MRN: [id number]  HPI Patient complaining of a papular lesion left thigh for area which is grown in size in the past 2 to 3 months.  Patient states he was told he was exposed by his partner to venereal warts few years ago.   Review of Systems Negative except for chief complaint    Objective:   Physical Exam BP is 117/78.  Pulse 62, respiration 12, temperature 97.5, patient 1% O2 sat on room air. Examination reveals papular lesion left inner thigh consistent with a wart.      Assessment & Plan:wart   Consult to dermatology to consider cryotherapy

## 2023-03-05 NOTE — Addendum Note (Signed)
Addended by: Aliene Altes on: 03/05/2023 11:18 AM   Modules accepted: Orders

## 2023-03-21 ENCOUNTER — Ambulatory Visit: Payer: Self-pay | Admitting: Physician Assistant

## 2023-03-21 ENCOUNTER — Encounter: Payer: Self-pay | Admitting: Physician Assistant

## 2023-03-21 VITALS — BP 120/77 | HR 65 | Temp 98.6°F | Resp 12 | Ht 68.0 in | Wt 135.0 lb

## 2023-03-21 DIAGNOSIS — S0501XA Injury of conjunctiva and corneal abrasion without foreign body, right eye, initial encounter: Secondary | ICD-10-CM

## 2023-03-21 MED ORDER — GENTAMICIN SULFATE 0.3 % OP SOLN: 1.0000 [drp] | OPHTHALMIC | 0 refills | Status: DC

## 2023-03-21 MED ORDER — NAPHAZOLINE-PHENIRAMINE 0.025-0.3 % OP SOLN: 1.0000 [drp] | Freq: Four times a day (QID) | OPHTHALMIC | 0 refills | Status: DC | PRN

## 2023-03-21 NOTE — Progress Notes (Signed)
Worker's Comp.  Subjective: Right eye pain    Patient ID: Tyler Oneill, male    DOB: October 20, 1986, 37 y.o.   MRN: [id number]  HPI Patient presents with right knee pain secondary to foreign body.  Patient stated he was using  a weed eater causing the blade of grass to go into his right eye.  Patient states copious irrigation with eyewash station but still feels a foreign body sensation.  Denies loss of vision.   Review of Systems Negative except for above complaint.    Objective:   Physical Exam  See nurses note for vital signs and visual acuity. No obvious foreign body or deformity.  Patient eye was anesthetized and fluorescein revealed small corneal abrasion.      Assessment & Plan: Right corneal abrasion   Patient right was anesthetized and fluorescein stain revealed small corneal abrasion.  Patient was copiously irrigated.  Patient given prescription for gentamicin and Naphcon.  Patient advised to follow-up if condition worsens.

## 2023-06-19 ENCOUNTER — Other Ambulatory Visit: Payer: Self-pay | Admitting: Physician Assistant

## 2023-06-19 DIAGNOSIS — L740 Miliaria rubra: Secondary | ICD-10-CM

## 2023-06-25 ENCOUNTER — Other Ambulatory Visit: Payer: Self-pay | Admitting: Physician Assistant

## 2023-06-25 ENCOUNTER — Ambulatory Visit: Payer: Self-pay | Admitting: Physician Assistant

## 2023-06-25 ENCOUNTER — Other Ambulatory Visit: Payer: Self-pay

## 2023-06-25 ENCOUNTER — Encounter: Payer: Self-pay | Admitting: Physician Assistant

## 2023-06-25 VITALS — BP 107/78 | HR 62 | Temp 97.8°F | Resp 12 | Ht 68.0 in | Wt 135.0 lb

## 2023-06-25 DIAGNOSIS — L740 Miliaria rubra: Secondary | ICD-10-CM

## 2023-06-25 DIAGNOSIS — R079 Chest pain, unspecified: Secondary | ICD-10-CM

## 2023-06-25 MED ORDER — PANTOPRAZOLE SODIUM 40 MG PO TBEC: 40.0000 mg | DELAYED_RELEASE_TABLET | Freq: Every day | ORAL | 2 refills | Status: DC

## 2023-06-25 MED ORDER — CLOBETASOL PROPIONATE 0.05 % EX CREA: 1.0000 | TOPICAL_CREAM | Freq: Two times a day (BID) | CUTANEOUS | 0 refills | Status: DC

## 2023-06-25 NOTE — Progress Notes (Signed)
Saturday morning woke up with tight chest pain & left side Slight headache - resolved  Family hx of HTN  Took some TUMS when it first happened -  Denies abdominal pain Passing gas Has had BM's over the weekend & today  Pain stops when he lays on his right side  Not affecting eating habits  States had chicken noodle soup Friday evening  Denies any activity that he could have strained muscles in the area  Denies coughing  Went to [employer] funeral Wednesday & works in [employer] and they had to make the preparations for the grave site.  Sherrine Maples was his co-worker.  AMD

## 2023-06-25 NOTE — Progress Notes (Signed)
   Subjective: Epigastric pain    Patient ID: Tyler Oneill, male    DOB: 1986/02/06, 37 y.o.   MRN: [id number]  HPI Patient complain of left epigastric pain which started approximately 4:00 Saturday morning.  Patient describes the pain as a burning aching sensation.  Denies nausea vomiting diarrhea.  Denies dyspnea.  Patient states pain increases with laying supine and left sleeping lateral positions.  Pain decreases right lateral sleeping position.  Patient was concerned due to family history of cardiac disease.  Patient states mild relief with over-the-counter Tums.   Review of Systems Negative except for above complaint.    Objective:   Physical Exam BP 107/78  BP Location Left Arm  Patient Position Sitting  Cuff Size Normal  Pulse 62  Resp 12  Temp 97.8 F (36.6 C)  Temp src Temporal  SpO2 98 %  Weight 135 lb (61.2 kg)  Height 5\' 8"  (1.727 m)   BMI 20.53 kg/m2  BSA 1.71 m2  Tobacco  Smoking status Former (Quit: 12/27/2018)  Used Cigarettes  No acute distress. HEENT is unremarkable. Neck is supple followed lymphadenectomy or bruits. Lungs are clear to auscultation.   Heart is regular rate and rhythm.  EKG shows bradycardia 59 bpm. Abdomen with negative HSM, decreased bowel sounds, soft and nontender to palpation.    Assessment & Plan: Epigastric pain  Discussed no acute findings EKG.  Discussed GERD.  Patient is amenable to a trial of protonic and a 2-week follow-up.

## 2023-07-05 DIAGNOSIS — L858 Other specified epidermal thickening: Secondary | ICD-10-CM | POA: Diagnosis not present

## 2023-07-05 DIAGNOSIS — A630 Anogenital (venereal) warts: Secondary | ICD-10-CM | POA: Diagnosis not present

## 2023-07-05 DIAGNOSIS — D225 Melanocytic nevi of trunk: Secondary | ICD-10-CM | POA: Diagnosis not present

## 2023-07-05 DIAGNOSIS — L218 Other seborrheic dermatitis: Secondary | ICD-10-CM | POA: Diagnosis not present

## 2023-07-05 DIAGNOSIS — L2089 Other atopic dermatitis: Secondary | ICD-10-CM | POA: Diagnosis not present

## 2023-07-05 DIAGNOSIS — L301 Dyshidrosis [pompholyx]: Secondary | ICD-10-CM | POA: Diagnosis not present

## 2023-07-05 DIAGNOSIS — D485 Neoplasm of uncertain behavior of skin: Secondary | ICD-10-CM | POA: Diagnosis not present

## 2023-07-09 ENCOUNTER — Other Ambulatory Visit: Payer: Self-pay | Admitting: Physician Assistant

## 2023-07-09 ENCOUNTER — Ambulatory Visit: Payer: Self-pay | Admitting: Physician Assistant

## 2023-07-09 ENCOUNTER — Encounter: Payer: Self-pay | Admitting: Physician Assistant

## 2023-07-09 VITALS — BP 120/74 | HR 64 | Resp 12 | Ht 68.0 in | Wt 135.0 lb

## 2023-07-09 DIAGNOSIS — K219 Gastro-esophageal reflux disease without esophagitis: Secondary | ICD-10-CM

## 2023-07-09 NOTE — Progress Notes (Signed)
   Subjective: Gastric reflux    Patient ID: Tyler Oneill, male    DOB: 1986/02/08, 37 y.o.   MRN: [id number]  HPI Patient 2-week follow-up for gastric reflux.  Patient stated he was skeptical of the medicine working since he noticed no improvement first week.  Patient stay on day 10 he realized he no longer needs a supplement with over-the-counter Tums.  No side effects of medication. Review of Systems Negative septa chief complaint    Objective:   Physical Exam BP 120/74  Pulse 64  Resp 12  Weight 135 lb (61.2 kg)  Height 5\' 8"  (1.727 m)  Exam deferred.       Assessment & Plan: GERD

## 2023-07-09 NOTE — Progress Notes (Signed)
Pt requesting refill on pantoprazole 40 mg. Pt states medication is working well.

## 2023-07-09 NOTE — Progress Notes (Unsigned)
   Subjective: GERD    Patient ID: Tyler Oneill, male    DOB: 1986/05/09, 37 y.o.   MRN: [id number]  HPI Patient is 2-week follow-up for gastric reflux.  Patient states protonic is now controlling his condition.  Patient was skeptical because he noticed no improvement in the first week.  Patient stabile day 10 his diagnosis and improvement.   Review of Systems Negative except for above complaint.    Objective:   Physical Exam        Assessment & Plan:

## 2023-07-26 DIAGNOSIS — D2271 Melanocytic nevi of right lower limb, including hip: Secondary | ICD-10-CM | POA: Diagnosis not present

## 2023-07-26 DIAGNOSIS — D485 Neoplasm of uncertain behavior of skin: Secondary | ICD-10-CM | POA: Diagnosis not present

## 2023-09-27 DIAGNOSIS — D485 Neoplasm of uncertain behavior of skin: Secondary | ICD-10-CM | POA: Diagnosis not present

## 2023-10-20 ENCOUNTER — Other Ambulatory Visit: Payer: Self-pay | Admitting: Physician Assistant

## 2023-12-27 ENCOUNTER — Other Ambulatory Visit: Payer: Self-pay | Admitting: Physician Assistant

## 2023-12-27 DIAGNOSIS — L740 Miliaria rubra: Secondary | ICD-10-CM

## 2023-12-28 ENCOUNTER — Other Ambulatory Visit: Payer: Self-pay

## 2023-12-28 DIAGNOSIS — K219 Gastro-esophageal reflux disease without esophagitis: Secondary | ICD-10-CM

## 2023-12-28 DIAGNOSIS — L740 Miliaria rubra: Secondary | ICD-10-CM

## 2023-12-28 MED ORDER — CLOBETASOL PROPIONATE 0.05 % EX CREA: 1.0000 | TOPICAL_CREAM | Freq: Two times a day (BID) | CUTANEOUS | 0 refills | Status: DC

## 2023-12-28 MED ORDER — PANTOPRAZOLE SODIUM 40 MG PO TBEC: 40.0000 mg | DELAYED_RELEASE_TABLET | Freq: Every day | ORAL | 3 refills | Status: AC

## 2024-02-06 ENCOUNTER — Ambulatory Visit: Payer: Self-pay

## 2024-02-06 DIAGNOSIS — Z0000 Encounter for general adult medical examination without abnormal findings: Secondary | ICD-10-CM

## 2024-02-06 NOTE — Progress Notes (Signed)
 Pt completed labs & hearing. Pt will complete UA at physical. Gretel Acre

## 2024-02-07 LAB — CMP12+LP+TP+TSH+6AC+CBC/D/PLT
ALT: 14 IU/L (ref 0–44)
AST: 20 IU/L (ref 0–40)
Albumin: 5.1 g/dL (ref 4.1–5.1)
Alkaline Phosphatase: 78 IU/L (ref 44–121)
BUN/Creatinine Ratio: 15 (ref 9–20)
BUN: 17 mg/dL (ref 6–20)
Basophils Absolute: 0.1 10*3/uL (ref 0.0–0.2)
Basos: 1 %
Bilirubin Total: 0.6 mg/dL (ref 0.0–1.2)
Calcium: 9.8 mg/dL (ref 8.7–10.2)
Chloride: 103 mmol/L (ref 96–106)
Chol/HDL Ratio: 2.6 ratio (ref 0.0–5.0)
Cholesterol, Total: 186 mg/dL (ref 100–199)
Creatinine, Ser: 1.14 mg/dL (ref 0.76–1.27)
EOS (ABSOLUTE): 0.1 10*3/uL (ref 0.0–0.4)
Eos: 2 %
Estimated CHD Risk: <0.5 times avg. (ref 0.0–1.0)
Free Thyroxine Index: 2.1 (ref 1.2–4.9)
GGT: 19 IU/L (ref 0–65)
Globulin, Total: 2.5 g/dL (ref 1.5–4.5)
Glucose: 83 mg/dL (ref 70–99)
HDL: 72 mg/dL (ref >39)
Hematocrit: 45.2 % (ref 37.5–51.0)
Hemoglobin: 15.2 g/dL (ref 13.0–17.7)
Immature Grans (Abs): 0 10*3/uL (ref 0.0–0.1)
Immature Granulocytes: 0 %
Iron: 128 ug/dL (ref 38–169)
LDH: 142 IU/L (ref 121–224)
LDL Chol Calc (NIH): 99 mg/dL (ref 0–99)
Lymphocytes Absolute: 3.1 10*3/uL (ref 0.7–3.1)
Lymphs: 37 %
MCH: 31 pg (ref 26.6–33.0)
MCHC: 33.6 g/dL (ref 31.5–35.7)
MCV: 92 fL (ref 79–97)
Monocytes Absolute: 0.5 10*3/uL (ref 0.1–0.9)
Monocytes: 6 %
Neutrophils Absolute: 4.5 10*3/uL (ref 1.4–7.0)
Neutrophils: 54 %
Phosphorus: 4 mg/dL (ref 2.8–4.1)
Platelets: 238 10*3/uL (ref 150–450)
Potassium: 4.1 mmol/L (ref 3.5–5.2)
RBC: 4.9 x10E6/uL (ref 4.14–5.80)
RDW: 13.1 % (ref 11.6–15.4)
Sodium: 142 mmol/L (ref 134–144)
T3 Uptake Ratio: 25 % (ref 24–39)
T4, Total: 8.5 ug/dL (ref 4.5–12.0)
TSH: 1.74 u[IU]/mL (ref 0.450–4.500)
Total Protein: 7.6 g/dL (ref 6.0–8.5)
Triglycerides: 81 mg/dL (ref 0–149)
Uric Acid: 4.6 mg/dL (ref 3.8–8.4)
VLDL Cholesterol Cal: 15 mg/dL (ref 5–40)
WBC: 8.4 10*3/uL (ref 3.4–10.8)
eGFR: 85 mL/min/{1.73_m2} (ref >59)

## 2024-02-13 ENCOUNTER — Encounter: Payer: Self-pay | Admitting: Physician Assistant

## 2024-02-13 ENCOUNTER — Ambulatory Visit: Payer: Self-pay | Admitting: Physician Assistant

## 2024-02-13 VITALS — BP 127/74 | HR 85 | Temp 98.0°F | Resp 16 | Wt 135.0 lb

## 2024-02-13 DIAGNOSIS — Z0000 Encounter for general adult medical examination without abnormal findings: Secondary | ICD-10-CM

## 2024-02-13 LAB — POCT URINALYSIS DIPSTICK
Bilirubin, UA: NEGATIVE
Blood, UA: NEGATIVE
Glucose, UA: NEGATIVE
Ketones, UA: NEGATIVE
Leukocytes, UA: NEGATIVE
Nitrite, UA: NEGATIVE
Protein, UA: NEGATIVE
Spec Grav, UA: 1.01 (ref 1.010–1.025)
Urobilinogen, UA: 0.2 U/dL
pH, UA: 6 (ref 5.0–8.0)

## 2024-02-13 NOTE — Progress Notes (Signed)
 Here for yearly physical with provider.  UA obtained.  Stated Protonix is helpful

## 2024-02-13 NOTE — Progress Notes (Signed)
 [facility] [place] [facility]   ____________________________________________   None    (approximate)  I have reviewed the triage vital signs and the nursing notes.   HISTORY  Chief Complaint No chief complaint on file.   HPI Tyler Oneill is a 38 y.o. male patient presents for annual physical exam.  Patient voices no concerns or complaints.         Past Medical History:  Diagnosis Date   Allergy    Phreesia 08/14/2020   Seasonal allergies     Patient Active Problem List   Diagnosis Date Noted   Low iron 04/26/2021   Headache disorder 03/20/2019    No past surgical history on file.  Prior to Admission medications   Medication Sig Start Date End Date Taking? Authorizing Provider  clobetasol cream (TEMOVATE) 0.05 % Apply 1 Application topically 2 (two) times daily. 12/28/23  Yes Joni Reining, PA-C  pantoprazole (PROTONIX) 40 MG tablet Take 1 tablet (40 mg total) by mouth daily. 12/28/23  Yes Joni Reining, PA-C  fluticasone [facility]) 50 MCG/ACT nasal spray Place 2 sprays into both nostrils daily. Patient not taking: Reported on 02/13/2024 04/25/22   Joni Reining, PA-C    Allergies Other and Nortriptyline  Family History  Problem Relation Age of Onset   Diabetes Father    Diabetes Maternal Grandmother    Diabetes Paternal Grandmother     Social History Social History   Tobacco Use   Smoking status: Former    Current packs/day: 0.00    Types: Cigarettes    Quit date: 12/27/2018    Years since quitting: 5.1   Smokeless tobacco: Never  Substance Use Topics   Alcohol use: No   Drug use: No    Review of Systems Constitutional: No fever/chills Eyes: No visual changes. ENT: No sore throat. Cardiovascular: Denies chest pain. Respiratory: Denies shortness of breath. Gastrointestinal: No abdominal pain.  No nausea, no vomiting.  No diarrhea.  No constipation.  GERD. Genitourinary: Negative for dysuria. Musculoskeletal:  Negative for back pain. Skin: Negative for rash. Neurological: Negative for headaches, focal weakness or numbness. Allergic/Immunilogical: Nortriptyline ____________________________________________   PHYSICAL EXAM:  VITAL SIGNS: BP 127/74  Pulse Rate 85  Temp 98 F (36.7 C)  Weight 135 lb (61.2 kg)  Resp 16  SpO2 100 %   Other Vitals   BMI: 20.53 kg/m2  BSA: 1.71 m2   Constitutional: Alert and oriented. Well appearing and in no acute distress. Eyes: Conjunctivae are normal. PERRL. EOMI. Head: Atraumatic. Nose: No congestion/rhinnorhea. Mouth/Throat: Mucous membranes are moist.  Oropharynx non-erythematous. Neck: No stridor.  No cervical spine tenderness to palpation. Hematological/Lymphatic/Immunilogical: No cervical lymphadenopathy. Cardiovascular: Normal rate, regular rhythm. Grossly normal heart sounds.  Good peripheral circulation. Respiratory: Normal respiratory effort.  No retractions. Lungs CTAB. Gastrointestinal: Soft and nontender. No distention. No abdominal bruits. No CVA tenderness. Genitourinary: Deferred Musculoskeletal: No lower extremity tenderness nor edema.  No joint effusions. Neurologic:  Normal speech and language. No gross focal neurologic deficits are appreciated. No gait instability. Skin:  Skin is warm, dry and intact. No rash noted. Psychiatric: Mood and affect are normal. Speech and behavior are normal.  ____________________________________________   LABS            Component Ref Range & Units (hover) 7 d ago (02/06/24) 1 yr ago (02/08/23) 1 yr ago (04/20/22) 2 yr ago (04/26/21) 4 yr ago (04/01/19) 4 yr ago (04/01/19) 13 yr ago (11/28/10)  Glucose 83 94 94 86  R 120 High   79  Uric Acid 4.6 4.1 CM 4.7 CM 3.5 Low  CM     Comment:            Therapeutic target for gout patients: <6.0  BUN 17 15 15 16 13  26  High  R  Creatinine, Ser 1.14 0.94 0.92 0.93 0.91 R  1.2 R  eGFR 85 108 111 111     BUN/Creatinine Ratio 15 16 16 17      Sodium 142 143  140 141 137 R  135 R  Potassium 4.1 3.9 4.4 4.4 3.8 R  4.4 R  Chloride 103 104 104 104 103 R  99 R  Calcium 9.8 9.6 9.3 9.4 9.2 R    Phosphorus 4.0 3.5 3.4 3.1     Total Protein 7.6 6.9 6.6 6.8     Albumin 5.1 4.8 4.2 R 4.7 R     Globulin, Total 2.5 2.1 2.4 2.1     Bilirubin Total 0.6 0.3 0.3 0.4     Alkaline Phosphatase 78 65 68 67     LDH 142 146 136 130     AST 20 20 25 23      ALT 14 18 14 16      GGT 19 22 14 20      Iron 128 66 75 74     Cholesterol, Total 186 155 151 134     Triglycerides 81 86 98 69     HDL 72 72 62 67     VLDL Cholesterol Cal 15 16 18 14      LDL Chol Calc (NIH) 99 67 71 53     Chol/HDL Ratio 2.6 2.2 CM 2.4 CM 2.0 CM     Comment:                                   T. Chol/HDL Ratio                                             Men  Women                               1/2 Avg.Risk  3.4    3.3                                   Avg.Risk  5.0    4.4                                2X Avg.Risk  9.6    7.1                                3X Avg.Risk 23.4   11.0  Estimated CHD Risk  < 0.5  < 0.5 CM  < 0.5 CM  < 0.5 CM     Comment: The CHD Risk is based on the T. Chol/HDL ratio. Other factors affect CHD Risk such as hypertension, smoking, diabetes, severe obesity, and family history of premature CHD.  TSH 1.740 1.570 1.510 1.200     T4,  Total 8.5 7.3 6.4 7.8     T3 Uptake Ratio 25 25 23  Low  24     Free Thyroxine Index 2.1 1.8 1.5 1.9     WBC 8.4 5.1 5.8 5.0  7.3 R   RBC 4.90 4.44 4.25 4.48  4.67 R   Hemoglobin 15.2 13.6 12.8 Low  13.5  14.1 R 16.0 R  Hematocrit 45.2 39.5 37.9 41.4  42.0 R 47.0 R  MCV 92 89 89 92  89.9 R   MCH 31.0 30.6 30.1 30.1  30.2 R   MCHC 33.6 34.4 33.8 32.6  33.6 R   RDW 13.1 13.3 13.0 13.1  14.2 R   Platelets 238 182 150 181  179 R   Neutrophils 54 61 56 41     Lymphs 37 28 34 44     Monocytes 6 7 6 10      Eos 2 3 3 4      Basos 1 1 1 1      Neutrophils Absolute 4.5 3.1 3.2 2.1     Lymphocytes Absolute 3.1 1.4 2.0 2.2     Monocytes  Absolute 0.5 0.3 0.3 0.5     EOS (ABSOLUTE) 0.1 0.2 0.2 0.2     Basophils Absolute 0.1 0.1 0.1 0.0     Immature Granulocytes 0 0 0 0     Immature Grans (Abs) 0.0 0.0 0.0 0.0                _  POCT urinalysis dipstick Order: [id number]  Status: Edited Result - FINAL     Next appt: None     Dx: Annual physical exam   Test Result Released: No (scheduled for 02/13/2024  4:17 PM)   0 Result Notes      Component Ref Range & Units (hover) 15:15 (02/13/24) 1 yr ago (02/08/23) 1 yr ago (04/20/22)  Color, UA yellow yellow dark yellow  Clarity, UA clear clear clear  Glucose, UA Negative Negative Negative  Bilirubin, UA neg VC neg negative  Ketones, UA neg neg negative  Spec Grav, UA 1.010 VC 1.025 1.025  Blood, UA neg neg negative  pH, UA 6.0 6.0 5.0  Protein, UA Negative VC Negative Negative  Urobilinogen, UA 0.2 0.2 0.2  Nitrite, UA neg neg negative  Leukocytes, UA Negative Negative Negative  Appearance  normal   Odor              _______________________________  EKG  Sinus bradycardia at 59 bpm ____________________________________________    ____________________________________________   INITIAL IMPRESSION / ASSESSMENT AND PLAN  As part of my medical decision making, I reviewed the following data within the electronic MEDICAL RECORD NUMBER      No acute findings on physical exam, EKG, or labs.        ____________________________________________   FINAL CLINICAL IMPRESSION  Well exam ED Discharge Orders          Ordered    POCT urinalysis dipstick        02/13/24 1507             Note:  This document was prepared using Dragon voice recognition software and may include unintentional dictation errors.

## 2024-02-22 ENCOUNTER — Other Ambulatory Visit: Payer: Self-pay

## 2024-02-22 DIAGNOSIS — L740 Miliaria rubra: Secondary | ICD-10-CM

## 2024-02-22 DIAGNOSIS — J020 Streptococcal pharyngitis: Secondary | ICD-10-CM

## 2024-02-22 LAB — POCT RAPID STREP A (OFFICE): Rapid Strep A Screen: NEGATIVE

## 2024-02-22 MED ORDER — CLOBETASOL PROPIONATE 0.05 % EX CREA: 1.0000 | TOPICAL_CREAM | Freq: Two times a day (BID) | CUTANEOUS | 0 refills | Status: DC

## 2024-02-22 NOTE — Progress Notes (Signed)
 Presents today with sore throat and drainage, negative for strep.

## 2024-03-26 ENCOUNTER — Other Ambulatory Visit: Payer: Self-pay

## 2024-03-26 DIAGNOSIS — Z0283 Encounter for blood-alcohol and blood-drug test: Secondary | ICD-10-CM

## 2024-03-26 NOTE — Progress Notes (Signed)
 Random UDS collected after consent for [facility] pick up DOT specimen.  BAT completed and cleared after consent for random protocol.

## 2024-04-05 ENCOUNTER — Other Ambulatory Visit: Payer: Self-pay | Admitting: Physician Assistant

## 2024-04-05 DIAGNOSIS — L740 Miliaria rubra: Secondary | ICD-10-CM

## 2024-04-07 ENCOUNTER — Other Ambulatory Visit: Payer: Self-pay

## 2024-04-07 DIAGNOSIS — L740 Miliaria rubra: Secondary | ICD-10-CM

## 2024-04-07 MED ORDER — CLOBETASOL PROPIONATE 0.05 % EX CREA: 1.0000 | TOPICAL_CREAM | Freq: Two times a day (BID) | CUTANEOUS | 1 refills | Status: DC

## 2024-06-26 DIAGNOSIS — H5213 Myopia, bilateral: Secondary | ICD-10-CM | POA: Diagnosis not present

## 2024-06-27 ENCOUNTER — Other Ambulatory Visit: Payer: Self-pay | Admitting: Physician Assistant

## 2024-06-27 DIAGNOSIS — L740 Miliaria rubra: Secondary | ICD-10-CM

## 2024-09-01 ENCOUNTER — Other Ambulatory Visit: Payer: Self-pay

## 2024-09-01 DIAGNOSIS — L740 Miliaria rubra: Secondary | ICD-10-CM

## 2024-09-01 MED ORDER — CLOBETASOL PROPIONATE 0.05 % EX CREA: TOPICAL_CREAM | Freq: Two times a day (BID) | CUTANEOUS | 1 refills | Status: DC

## 2024-10-19 ENCOUNTER — Other Ambulatory Visit: Payer: Self-pay | Admitting: Physician Assistant

## 2024-10-19 DIAGNOSIS — L740 Miliaria rubra: Secondary | ICD-10-CM

## 2024-12-23 ENCOUNTER — Ambulatory Visit: Payer: Self-pay

## 2024-12-23 DIAGNOSIS — Z0000 Encounter for general adult medical examination without abnormal findings: Secondary | ICD-10-CM

## 2024-12-23 LAB — POCT URINALYSIS DIPSTICK
Bilirubin, UA: NEGATIVE
Blood, UA: NEGATIVE
Glucose, UA: NEGATIVE
Ketones, UA: NEGATIVE
Leukocytes, UA: NEGATIVE
Nitrite, UA: NEGATIVE
Protein, UA: NEGATIVE
Spec Grav, UA: 1.02
Urobilinogen, UA: 0.2 U/dL
pH, UA: 6

## 2024-12-24 LAB — CMP12+LP+TP+TSH+6AC+CBC/D/PLT
ALT: 10 IU/L (ref 0–44)
AST: 17 IU/L (ref 0–40)
Albumin: 4.6 g/dL (ref 4.1–5.1)
Alkaline Phosphatase: 64 IU/L (ref 47–123)
BUN/Creatinine Ratio: 16 (ref 9–20)
BUN: 13 mg/dL (ref 6–20)
Basophils Absolute: 0 x10E3/uL (ref 0.0–0.2)
Basos: 1 %
Bilirubin Total: 0.4 mg/dL (ref 0.0–1.2)
Calcium: 9.6 mg/dL (ref 8.7–10.2)
Chloride: 102 mmol/L (ref 96–106)
Chol/HDL Ratio: 1.9 ratio (ref 0.0–5.0)
Cholesterol, Total: 147 mg/dL (ref 100–199)
Creatinine, Ser: 0.8 mg/dL (ref 0.76–1.27)
EOS (ABSOLUTE): 0.1 x10E3/uL (ref 0.0–0.4)
Eos: 2 %
Free Thyroxine Index: 2 (ref 1.2–4.9)
GGT: 15 IU/L (ref 0–65)
Globulin, Total: 2.3 g/dL (ref 1.5–4.5)
Glucose: 88 mg/dL (ref 70–99)
HDL: 78 mg/dL
Hematocrit: 42.1 % (ref 37.5–51.0)
Hemoglobin: 13.8 g/dL (ref 13.0–17.7)
Immature Grans (Abs): 0 x10E3/uL (ref 0.0–0.1)
Immature Granulocytes: 0 %
Iron: 87 ug/dL (ref 38–169)
LDH: 136 IU/L (ref 121–224)
LDL Chol Calc (NIH): 53 mg/dL (ref 0–99)
Lymphocytes Absolute: 2.6 x10E3/uL (ref 0.7–3.1)
Lymphs: 40 %
MCH: 30.4 pg (ref 26.6–33.0)
MCHC: 32.8 g/dL (ref 31.5–35.7)
MCV: 93 fL (ref 79–97)
Monocytes Absolute: 0.5 x10E3/uL (ref 0.1–0.9)
Monocytes: 8 %
Neutrophils Absolute: 3.2 x10E3/uL (ref 1.4–7.0)
Neutrophils: 49 %
Phosphorus: 3.6 mg/dL (ref 2.8–4.1)
Platelets: 215 x10E3/uL (ref 150–450)
Potassium: 3.9 mmol/L (ref 3.5–5.2)
RBC: 4.54 x10E6/uL (ref 4.14–5.80)
RDW: 13.5 % (ref 11.6–15.4)
Sodium: 140 mmol/L (ref 134–144)
T3 Uptake Ratio: 27 % (ref 24–39)
T4, Total: 7.5 ug/dL (ref 4.5–12.0)
TSH: 1.88 u[IU]/mL (ref 0.450–4.500)
Total Protein: 6.9 g/dL (ref 6.0–8.5)
Triglycerides: 82 mg/dL (ref 0–149)
Uric Acid: 4 mg/dL (ref 3.8–8.4)
VLDL Cholesterol Cal: 16 mg/dL (ref 5–40)
WBC: 6.5 x10E3/uL (ref 3.4–10.8)
eGFR: 117 mL/min/1.73

## 2024-12-29 ENCOUNTER — Encounter: Admitting: Physician Assistant

## 2024-12-30 ENCOUNTER — Encounter: Admitting: Physician Assistant

## 2024-12-31 ENCOUNTER — Ambulatory Visit: Payer: Self-pay | Admitting: Physician Assistant

## 2024-12-31 ENCOUNTER — Encounter: Payer: Self-pay | Admitting: Physician Assistant

## 2024-12-31 VITALS — BP 113/64 | HR 71 | Resp 16 | Ht 68.0 in | Wt 145.0 lb

## 2024-12-31 DIAGNOSIS — Z0000 Encounter for general adult medical examination without abnormal findings: Secondary | ICD-10-CM

## 2024-12-31 NOTE — Progress Notes (Signed)
 "  [facility] [place] [facility]   ____________________________________________   None    (approximate)  I have reviewed the triage vital signs and the nursing notes.   HISTORY  Chief Complaint Annual Exam   HPI Tyler Oneill is a 39 y.o. male patient presents for annual physical exam for the city [facility] department.  Patient voices no concerns or complaints.         Past Medical History:  Diagnosis Date   Allergy    Phreesia 08/14/2020   Seasonal allergies     Patient Active Problem List   Diagnosis Date Noted   Low iron 04/26/2021   Headache disorder 03/20/2019    No past surgical history on file.  Prior to Admission medications  Medication Sig Start Date End Date Taking? Authorizing Provider  clobetasol  cream (TEMOVATE ) 0.05 % APPLY TOPICALLY TWICE A DAY 10/20/24  Yes Claudene Tanda POUR, PA-C  pantoprazole  (PROTONIX ) 40 MG tablet Take 1 tablet (40 mg total) by mouth daily. 12/28/23  Yes Claudene Tanda POUR, PA-C  fexofenadine  (ALLEGRA) 60 MG tablet Take 60 mg by mouth daily.    [provider]    Allergies Other and Nortriptyline  Family History  Problem Relation Age of Onset   Diabetes Father    Diabetes Maternal Grandmother    Diabetes Paternal Grandmother     Social History Social History[1]  Review of Systems Constitutional: No fever/chills Eyes: No visual changes. ENT: No sore throat. Cardiovascular: Denies chest pain. Respiratory: Denies shortness of breath. Gastrointestinal: No abdominal pain.  No nausea, no vomiting.  No diarrhea.  No constipation. Genitourinary: Negative for dysuria. Musculoskeletal: Negative for back pain. Skin: Negative for rash. Neurological: Negative for headaches, focal weakness or numbness. Allergic/Immunilogical: Nortriptyline ____________________________________________   PHYSICAL EXAM:  VITAL SIGNS: BP 113/64  Cuff Size Normal  Pulse Rate 71  Weight 145 lb (65.8  kg)  Height 5' 8 (1.727 m)  Resp 16  SpO2 100 %   BMI: 22.05 kg/m2  BSA: 1.78 m2   Constitutional: Alert and oriented. Well appearing and in no acute distress. Eyes: Conjunctivae are normal. PERRL. EOMI. Head: Atraumatic. Nose: No congestion/rhinnorhea. Mouth/Throat: Mucous membranes are moist.  Oropharynx non-erythematous. Neck: No stridor.  No cervical spine tenderness to palpation. Hematological/Lymphatic/Immunilogical: No cervical lymphadenopathy. Cardiovascular: Normal rate, regular rhythm. Grossly normal heart sounds.  Good peripheral circulation. Respiratory: Normal respiratory effort.  No retractions. Lungs CTAB. Gastrointestinal: Soft and nontender. No distention. No abdominal bruits. No CVA tenderness. Genitourinary: Deferred Musculoskeletal: No lower extremity tenderness nor edema.  No joint effusions. Neurologic:  Normal speech and language. No gross focal neurologic deficits are appreciated. No gait instability. Skin:  Skin is warm, dry and intact. No rash noted. Psychiatric: Mood and affect are normal. Speech and behavior are normal.  ____________________________________________   LABS        Component Ref Range & Units (hover) 8 d ago 10 mo ago 1 yr ago 2 yr ago  Color, UA dark yellow yellow yellow dark yellow  Clarity, UA clear clear clear clear  Glucose, UA Negative Negative Negative Negative  Bilirubin, UA neg neg VC neg negative  Ketones, UA neg neg neg negative  Spec Grav, UA 1.020 1.010 VC 1.025 1.025  Blood, UA neg neg neg negative  pH, UA 6.0 6.0 6.0 5.0  Protein, UA Negative Negative VC Negative Negative  Urobilinogen, UA 0.2 0.2 0.2 0.2  Nitrite, UA neg neg neg negative  Leukocytes, UA Negative Negative Negative  Negative  Appearance   normal   Odor                        Component Ref Range & Units (hover) 8 d ago (12/23/24) 10 mo ago (02/06/24) 1 yr ago (02/08/23) 2 yr ago (04/20/22) 3 yr ago (04/26/21) 5 yr ago (04/01/19) 5 yr  ago (04/01/19)  Glucose 88 83 94 94 86 R 120 High    Uric Acid 4.0 4.6 CM 4.1 CM 4.7 CM 3.5 Low  CM    Comment:            Therapeutic target for gout patients: <6.0  BUN 13 17 15 15 16 13    Creatinine, Ser 0.80 1.14 0.94 0.92 0.93 0.91 R   eGFR 117 85 108 111 111    BUN/Creatinine Ratio 16 15 16 16 17     Sodium 140 142 143 140 141 137 R   Potassium 3.9 4.1 3.9 4.4 4.4 3.8 R   Chloride 102 103 104 104 104 103 R   Calcium 9.6 9.8 9.6 9.3 9.4 9.2 R   Phosphorus 3.6 4.0 3.5 3.4 3.1    Total Protein 6.9 7.6 6.9 6.6 6.8    Albumin 4.6 5.1 4.8 4.2 R 4.7 R    Globulin, Total 2.3 2.5 2.1 2.4 2.1    Bilirubin Total 0.4 0.6 0.3 0.3 0.4    Alkaline Phosphatase 64 78 R 65 R 68 R 67 R    LDH 136 142 146 136 130    AST 17 20 20 25 23     ALT 10 14 18 14 16     GGT 15 19 22 14 20     Iron 87 128 66 75 74    Cholesterol, Total 147 186 155 151 134    Triglycerides 82 81 86 98 69    HDL 78 72 72 62 67    VLDL Cholesterol Cal 16 15 16 18 14     LDL Chol Calc (NIH) 53 99 67 71 53    Chol/HDL Ratio 1.9 2.6 CM 2.2 CM 2.4 CM 2.0 CM    Comment:                                   T. Chol/HDL Ratio                                             Men  Women                               1/2 Avg.Risk  3.4    3.3                                   Avg.Risk  5.0    4.4                                2X Avg.Risk  9.6    7.1  3X Avg.Risk 23.4   11.0  Estimated CHD Risk  < 0.5  < 0.5 CM  < 0.5 CM  < 0.5 CM  < 0.5 CM    Comment: The CHD Risk is based on the T. Chol/HDL ratio. Other factors affect CHD Risk such as hypertension, smoking, diabetes, severe obesity, and family history of premature CHD.  TSH 1.880 1.740 1.570 1.510 1.200    T4, Total 7.5 8.5 7.3 6.4 7.8    T3 Uptake Ratio 27 25 25 23  Low  24    Free Thyroxine Index 2.0 2.1 1.8 1.5 1.9    WBC 6.5 8.4 5.1 5.8 5.0  7.3 R  RBC 4.54 4.90 4.44 4.25 4.48  4.67 R  Hemoglobin 13.8 15.2 13.6 12.8 Low  13.5  14.1 R  Hematocrit 42.1  45.2 39.5 37.9 41.4  42.0 R  MCV 93 92 89 89 92  89.9 R  MCH 30.4 31.0 30.6 30.1 30.1  30.2 R  MCHC 32.8 33.6 34.4 33.8 32.6  33.6 R  RDW 13.5 13.1 13.3 13.0 13.1  14.2 R  Platelets 215 238 182 150 181  179 R  Neutrophils 49 54 61 56 41    Lymphs 40 37 28 34 44    Monocytes 8 6 7 6 10     Eos 2 2 3 3 4     Basos 1 1 1 1 1     Neutrophils Absolute 3.2 4.5 3.1 3.2 2.1    Lymphocytes Absolute 2.6 3.1 1.4 2.0 2.2    Monocytes Absolute 0.5 0.5 0.3 0.3 0.5    EOS (ABSOLUTE) 0.1 0.1 0.2 0.2 0.2    Basophils Absolute 0.0 0.1 0.1 0.1 0.0    Immature Granulocytes 0 0 0 0 0    Immature Grans (Abs) 0.0 0.0 0.0 0.0 0.0                ____________________________________________            Component Ref Range & Units (hover) 8 d ago (12/23/24) 10 mo ago (02/06/24) 1 yr ago (02/08/23) 2 yr ago (04/20/22) 3 yr ago (04/26/21) 5 yr ago (04/01/19) 5 yr ago (04/01/19)  Glucose 88 83 94 94 86 R 120 High    Uric Acid 4.0 4.6 CM 4.1 CM 4.7 CM 3.5 Low  CM    Comment:            Therapeutic target for gout patients: <6.0  BUN 13 17 15 15 16 13    Creatinine, Ser 0.80 1.14 0.94 0.92 0.93 0.91 R   eGFR 117 85 108 111 111    BUN/Creatinine Ratio 16 15 16 16 17     Sodium 140 142 143 140 141 137 R   Potassium 3.9 4.1 3.9 4.4 4.4 3.8 R   Chloride 102 103 104 104 104 103 R   Calcium 9.6 9.8 9.6 9.3 9.4 9.2 R   Phosphorus 3.6 4.0 3.5 3.4 3.1    Total Protein 6.9 7.6 6.9 6.6 6.8    Albumin 4.6 5.1 4.8 4.2 R 4.7 R    Globulin, Total 2.3 2.5 2.1 2.4 2.1    Bilirubin Total 0.4 0.6 0.3 0.3 0.4    Alkaline Phosphatase 64 78 R 65 R 68 R 67 R    LDH 136 142 146 136 130    AST 17 20 20 25 23     ALT 10 14 18 14 16     GGT 15 19 22 14 20     Iron  87 128 66 75 74    Cholesterol, Total 147 186 155 151 134    Triglycerides 82 81 86 98 69    HDL 78 72 72 62 67    VLDL Cholesterol Cal 16 15 16 18 14     LDL Chol Calc (NIH) 53 99 67 71 53    Chol/HDL Ratio 1.9 2.6 CM 2.2 CM 2.4 CM 2.0 CM    Comment:                                    T. Chol/HDL Ratio                                             Men  Women                               1/2 Avg.Risk  3.4    3.3                                   Avg.Risk  5.0    4.4                                2X Avg.Risk  9.6    7.1                                3X Avg.Risk 23.4   11.0  Estimated CHD Risk  < 0.5  < 0.5 CM  < 0.5 CM  < 0.5 CM  < 0.5 CM    Comment: The CHD Risk is based on the T. Chol/HDL ratio. Other factors affect CHD Risk such as hypertension, smoking, diabetes, severe obesity, and family history of premature CHD.  TSH 1.880 1.740 1.570 1.510 1.200    T4, Total 7.5 8.5 7.3 6.4 7.8    T3 Uptake Ratio 27 25 25 23  Low  24    Free Thyroxine Index 2.0 2.1 1.8 1.5 1.9    WBC 6.5 8.4 5.1 5.8 5.0  7.3 R  RBC 4.54 4.90 4.44 4.25 4.48  4.67 R  Hemoglobin 13.8 15.2 13.6 12.8 Low  13.5  14.1 R  Hematocrit 42.1 45.2 39.5 37.9 41.4  42.0 R  MCV 93 92 89 89 92  89.9 R  MCH 30.4 31.0 30.6 30.1 30.1  30.2 R  MCHC 32.8 33.6 34.4 33.8 32.6  33.6 R  RDW 13.5 13.1 13.3 13.0 13.1  14.2 R  Platelets 215 238 182 150 181  179 R  Neutrophils 49 54 61 56 41    Lymphs 40 37 28 34 44    Monocytes 8 6 7 6 10     Eos 2 2 3 3 4     Basos 1 1 1 1 1     Neutrophils Absolute 3.2 4.5 3.1 3.2 2.1    Lymphocytes Absolute 2.6 3.1 1.4 2.0 2.2    Monocytes Absolute 0.5 0.5 0.3 0.3 0.5    EOS (ABSOLUTE) 0.1 0.1 0.2 0.2 0.2    Basophils Absolute 0.0 0.1 0.1 0.1 0.0  Immature Granulocytes 0 0 0 0 0    Immature Grans (Abs) 0.0 0.0 0.0 0.0 0.0                   ____________________________________________   INITIAL IMPRESSION / ASSESSMENT AND PLAN   As part of my medical decision making, I reviewed the following data within the electronic MEDICAL RECORD NUMBER      No acute findings on physical exam or labs.        ____________________________________________   FINAL CLINICAL IMPRESSION Well exam   ED Discharge Orders     None        Note:  This document was  prepared using Dragon voice recognition software and may include unintentional dictation errors.     [1]  Social History Tobacco Use   Smoking status: Former    Current packs/day: 0.00    Types: Cigarettes    Quit date: 12/27/2018    Years since quitting: 6.0   Smokeless tobacco: Never  Substance Use Topics   Alcohol use: No   Drug use: No   "

## 2024-12-31 NOTE — Progress Notes (Signed)
 Pt presents today to complete physical, Pt did not voice any concerns at this time.
